# Patient Record
Sex: Female | Born: 1990
Health system: Southern US, Community
[De-identification: ages and names within clinical notes are randomized; demographics above are authoritative.]

## PROBLEM LIST (undated history)

## (undated) ENCOUNTER — Emergency Department: Discharge status: Absent without leave | Payer: Self-pay

## (undated) DIAGNOSIS — J45909 Unspecified asthma, uncomplicated: Secondary | ICD-10-CM

## (undated) DIAGNOSIS — L309 Dermatitis, unspecified: Secondary | ICD-10-CM

## (undated) DIAGNOSIS — F419 Anxiety disorder, unspecified: Secondary | ICD-10-CM

## (undated) DIAGNOSIS — Z9289 Personal history of other medical treatment: Secondary | ICD-10-CM

## (undated) HISTORY — DX: Dermatitis, unspecified: L30.9

## (undated) HISTORY — DX: Personal history of other medical treatment: Z92.89

## (undated) HISTORY — PX: OTHER SURGICAL HISTORY: SHX169

## (undated) HISTORY — PX: NO PAST SURGERIES: SHX2092

---

## 2012-08-19 ENCOUNTER — Ambulatory Visit
Admission: RE | Admit: 2012-08-19 | Discharge: 2012-08-19 | Disposition: A | Discharge status: Home / Self Care | Payer: PRIVATE HEALTH INSURANCE | Source: Ambulatory Visit | Attending: Family Medicine | Admitting: Family Medicine

## 2012-08-19 ENCOUNTER — Other Ambulatory Visit: Payer: Self-pay | Admitting: Family Medicine

## 2012-08-19 DIAGNOSIS — R52 Pain, unspecified: Secondary | ICD-10-CM

## 2013-08-10 ENCOUNTER — Emergency Department (HOSPITAL_COMMUNITY)
Admission: EM | Admit: 2013-08-10 | Discharge: 2013-08-10 | Disposition: A | Discharge status: Home / Self Care | Payer: PRIVATE HEALTH INSURANCE | Source: Home / Self Care

## 2015-10-16 ENCOUNTER — Emergency Department (HOSPITAL_COMMUNITY)
Admission: EM | Admit: 2015-10-16 | Discharge: 2015-10-17 | Disposition: A | Discharge status: Home / Self Care | Payer: Self-pay | Attending: Emergency Medicine | Admitting: Emergency Medicine

## 2015-10-16 DIAGNOSIS — F419 Anxiety disorder, unspecified: Secondary | ICD-10-CM | POA: Insufficient documentation

## 2015-10-16 DIAGNOSIS — J45901 Unspecified asthma with (acute) exacerbation: Secondary | ICD-10-CM | POA: Insufficient documentation

## 2015-10-16 DIAGNOSIS — Z79899 Other long term (current) drug therapy: Secondary | ICD-10-CM | POA: Insufficient documentation

## 2015-10-16 HISTORY — DX: Unspecified asthma, uncomplicated: J45.909

## 2015-10-17 ENCOUNTER — Encounter (HOSPITAL_COMMUNITY): Payer: Self-pay | Admitting: Family Medicine

## 2015-10-17 LAB — I-STAT CHEM 8, ED
BUN: 9 mg/dL (ref 6–20)
CALCIUM ION: 1.18 mmol/L (ref 1.12–1.23)
CHLORIDE: 104 mmol/L (ref 101–111)
CREATININE: 0.8 mg/dL (ref 0.44–1.00)
GLUCOSE: 82 mg/dL (ref 65–99)
HCT: 41 % (ref 36.0–46.0)
Hemoglobin: 13.9 g/dL (ref 12.0–15.0)
POTASSIUM: 3.9 mmol/L (ref 3.5–5.1)
Sodium: 140 mmol/L (ref 135–145)
TCO2: 24 mmol/L (ref 0–100)

## 2015-10-17 LAB — CBC WITH DIFFERENTIAL/PLATELET
BASOS ABS: 0 10*3/uL (ref 0.0–0.1)
Basophils Relative: 0 %
Eosinophils Absolute: 0.3 10*3/uL (ref 0.0–0.7)
Eosinophils Relative: 4 %
HEMATOCRIT: 36.9 % (ref 36.0–46.0)
HEMOGLOBIN: 12.6 g/dL (ref 12.0–15.0)
LYMPHS ABS: 2.9 10*3/uL (ref 0.7–4.0)
LYMPHS PCT: 39 %
MCH: 32.4 pg (ref 26.0–34.0)
MCHC: 34.1 g/dL (ref 30.0–36.0)
MCV: 94.9 fL (ref 78.0–100.0)
Monocytes Absolute: 0.5 10*3/uL (ref 0.1–1.0)
Monocytes Relative: 7 %
NEUTROS ABS: 3.7 10*3/uL (ref 1.7–7.7)
NEUTROS PCT: 50 %
PLATELETS: 303 10*3/uL (ref 150–400)
RBC: 3.89 MIL/uL (ref 3.87–5.11)
RDW: 12.4 % (ref 11.5–15.5)
WBC: 7.4 10*3/uL (ref 4.0–10.5)

## 2015-10-17 NOTE — ED Notes (Signed)
Pt reports she got woke up with shortness of breath,  Stating "I could not control my movements".  She reports this happened 3 times on 12/21. Pt appears in no acute distress and can use move all extremities.

## 2015-10-17 NOTE — Discharge Instructions (Signed)
Panic Attacks Panic attacks are sudden, short feelings of great fear or discomfort. You may have them for no reason when you are relaxed, when you are uneasy (anxious), or when you are sleeping.  HOME CARE  Take all your medicines as told.  Check with your doctor before starting new medicines.  Keep all doctor visits. GET HELP IF:  You are not able to take your medicines as told.  Your symptoms do not get better.  Your symptoms get worse. GET HELP RIGHT AWAY IF:  Your attacks seem different than your normal attacks.  You have thoughts about hurting yourself or others.  You take panic attack medicine and you have a side effect. MAKE SURE YOU:  Understand these instructions.  Will watch your condition.  Will get help right away if you are not doing well or get worse.   This information is not intended to replace advice given to you by your health care provider. Make sure you discuss any questions you have with your health care provider.   Document Released: 11/02/2010 Document Revised: 07/21/2013 Document Reviewed: 05/14/2013 Elsevier Interactive Patient Education [employer] Please try to avoid caffeine.  Please monitor any further episodes similar to tonight's keep this in a diary for your primary care physician.  Return anytime you concerned about her overall health  Emergency Department Resource Guide 1) Find a Doctor and Pay Out of Pocket Although you won't have to find out who is covered by your insurance plan, it is a good idea to ask around and get recommendations. You will then need to call the office and see if the doctor you have chosen will accept you as a new patient and what types of options they offer for patients who are self-pay. Some doctors offer discounts or will set up payment plans for their patients who do not have insurance, but you will need to ask so you aren't surprised when you get to your appointment.  2) Contact Your Local Health  Department Not all health departments have doctors that can see patients for sick visits, but many do, so it is worth a call to see if yours does. If you don't know where your local health department is, you can check in your phone book. The CDC also has a tool to help you locate your state's health department, and many state websites also have listings of all of their local health departments.  3) Find a Walk-in Clinic If your illness is not likely to be very severe or complicated, you may want to try a walk in clinic. These are popping up all over the country in pharmacies, drugstores, and shopping centers. They're usually staffed by nurse practitioners or physician assistants that have been trained to treat common illnesses and complaints. They're usually fairly quick and inexpensive. However, if you have serious medical issues or chronic medical problems, these are probably not your best option.  No Primary Care Doctor: - Call Health Connect at  [phone] - they can help you locate a primary care doctor that  accepts your insurance, provides certain services, etc. - Physician Referral Service- [phone]  Chronic Pain Problems: Organization         Address  Phone   Notes  [facility]  [phone] Patients need to be referred by their primary care doctor.   Medication Assistance: Organization         Address  Phone   Notes  [facility] Medication Assistance Program [phone]  [address] [place], [place] [address] [phone]) [phone] --Must be a resident of [place] [place] -- Must have NO insurance coverage whatsoever (no Medicaid/ Medicare, etc.) -- The pt. MUST have a primary care doctor that directs their care regularly and follows them in the community   MedAssist  [phone]) [phone]   [employer]  [phone]) [phone]    Agencies that provide inexpensive medical care: Organization         Address  Phone   Notes  [facility]  [phone]   [facility]    [phone](336) [phone]   [facility] [address] [place], [place] [address] [phone]) [phone]   [facility] [address], [place] [phone]) [phone]   [facility]anned Parenthood    [phone]) [phone]   [facility]    [phone]) [phone]   [facility]  [address], [place] Phone:  [phone]) [phone], Fax:  [phone] Hours of Operation:  9 am - 6 pm, M-F.  Also accepts Medicaid/Medicare and self-pay.  [facility]  [address], [place] Phone: [phone](336) [phone], Fax: [phone]. Hours of Operation:  8:30 am - 5:30 pm, M-F.  Also accepts Medicaid and self-pay.  [facility] [place] [address], [place] Phone: [phone](336) [phone]   [facility] [address], [place], [place] [phone], Ext. [phone] Mondays & Thursdays: 7-9 AM.  First 15 patients are seen on a first come, first serve basis.    Medicaid-accepting [facility] Providers:  Organization         Address  Phone   Notes  [facility] [address], [place] [phone]) [phone] Also accepts self-pay patients.  [facility] [address], [place]  [phone]) [phone]   [facility] [address], [place] [phone]) [phone]   [facility] [address] [place] [address], [place] [phone]) [phone]   Renaye RakersVeita Bland [address], [place]   [phone](336) [phone] Only accepts [place] Access [place] patients after they have their name applied to their card.   Self-Pay (no insurance) in [facility]:  Organization         Address  Phone   Notes  Sickle Cell Patients, [facility] [address], [place] [phone]) [phone]   [facility] [address], [place] [phone]) [phone]   [facility] [place]  [address],  [place] [phone]) [phone]   [facility]/Dr. Osei-Bonsu  [address] [place] [address], [place] or [phone], [place] [phone]) [phone] Phone number for both [place] and [place] locations is the same.  [facility] [address], [place] [phone]) [phone]   [facility] [place] [address] [place] [address], [place] or [address] [phone](336) [phone]   [facility] [address], [place] [phone](336) [phone], phone; [phone]) [phone], fax Sees patients 1st and 3rd Saturday of every month.  Must not qualify for public or private insurance (i.e. Medicaid, Medicare, [place] Health Choice, Veterans' Benefits)  Household income should be no more than 200% of the poverty level The clinic cannot treat you if you are pregnant or think you are pregnant  Sexually transmitted diseases are not treated at the clinic.  Dental Care: Organization         Address  Phone  Notes  [facility] Department of [facility] [address] [place], [place] [phone] Accepts children up to age 34 who are enrolled in [place] or [place] Health Choice; pregnant women with a Medicaid card; and children who have applied for Medicaid or [place] Health Choice, but were declined, whose parents can pay a reduced fee at time of service.  [facility] Department of [facility]  [address], [place] [phone] Accepts children up to age 64 who are enrolled in [place] or [place] Health Choice; pregnant women with a Medicaid card; and children who have applied for Medicaid or [place] Health Choice, but were declined, whose parents can pay a reduced fee at time of service.  [facility]  [address] [place], [place] [phone] Patients are seen by appointment only. Walk-ins are not accepted. [facility] will see patients 8 years of age and older. Monday - Tuesday (8am-5pm) Most Wednesdays  (8:30-5pm) $30 per visit, cash only  [facility]  [address], [facility] [phone] Patients are seen by appointment only. Walk-ins are not accepted. [facility] will see patients 59 years of age and older. One Wednesday Evening (Monthly: Volunteer Based).  $30 per visit, cash only  [employer]  [phone] for adults; Children under age 36, call Graduate Pediatric Dentistry at [phone]. Children aged 72-14, please call [phone] to request a pediatric application.  Dental services are provided in all areas of dental care including fillings, crowns and bridges, complete and partial dentures, implants, gum treatment, root canals, and extractions. Preventive care is also provided. Treatment is provided to both adults and children. Patients are selected via a lottery and there is often a waiting list.   [facility] [address] [place]  [phone] [email or web address]   [facility] [address] [place], [place] [phone], Ext. [phone] Second and Fourth Thursday of each month, opens at 6:30 AM; Clinic ends at 9 AM.  Patients are seen on a first-come first-served basis, and a limited number are seen during each clinic.   [facility]  [address] [place], [place] [phone]   Eligibility Requirements You must have lived in [place], [place], or [place] counties for at least the last three months.   You cannot be eligible for state or federal sponsored [place], including [employer], [place], or [employer].   You generally cannot be eligible for healthcare insurance through your employer.    How to apply: Eligibility screenings are held every Tuesday and Wednesday afternoon from 1:00 pm until 4:00 pm. You do not need an appointment for the interview!  [facility] [address], [place], [place] [phone]   [facility]  Health Department  [phone]   [facility] Health Department  [phone]   [facility] Health Department  [phone]    Behavioral Health Resources in the Community: Intensive Outpatient Programs Organization         Address  Phone  Notes  [facility] [address], [place], [place] [phone]   [facility] [address] [place], [place] [phone]   [facility] [address] [place], [place]  [phone]   [facility] Mental Health [address],  [place], [place] [phone] or [phone]   Substance Abuse Resources Organization         Address  Phone  Notes  Alcohol [facility] Drug Services  [phone]   [facility]  [phone]   [facility] [place]  [phone]   Floydene Flock  [phone]   Residential & Outpatient Substance Abuse Program  [phone]   Psychological Services Organization         Address  Phone  Notes  [facility]  [phone]   [facility]  [phone]   [facility] [address], [place] [phone] or [phone]    Mobile Crisis Teams Organization         Address  Phone  Notes  [facility], Mobile Crisis Care Unit  [phone]   [facility]  [address] [place], [place] [phone]   Doristine Locks [address] [place] [phone]    Self-Help/Support Groups Organization         Address  Phone             Notes  [facility] [place] - variety of support groups  [phone]- [phone] Call for more information  Narcotics Anonymous (NA), [facility] [address], [employer] [place]  2 meetings at this location   Statistician         Address  Phone  Notes  [facility] [address],    [place]  [phone]   [facility]  [address], [place] [address], [place], [place]  [phone]   [facility] [address] [place], [place] [phone] Admissions: 8am-3pm M-F  [facility] [address]    [place], [place] [phone]   [facility] [address] [place], [place], [place] [phone]   [facility] [address],  [place], [place] [phone]   [facility] - Intensive Outpatient [address], [place], [place] [phone]   [facility] (Addiction Recovery [facility] [address]  [place], [place] [phone] or [phone]   [facility] ([facility]) [address] [place], [place] [phone] Accepts Medicaid  [facility] [address],  [place] [phone] Substance Abuse/Addiction Treatment   [facility] Organization         Address  Phone  Notes  [facility]  [phone]   Angie Fava, PhD [address] [place], [place]   [phone] or [phone]   [facility]al   [address] [place], [place] [phone]   [facility] [address] [place], [place] [phone] Insurance/Medicaid/sponsorship through [facility] and Families [address]                                    [place], [place] [phone] Therapy/tele-psych/case  [facility] [address], [place] [phone]    Dr. Lolly Mustache  [phone]   [facility]  [employer] [facility] Dept. 1) [address] [place] 2) [address] [place] 3)  [address], [place] [phone]  [phone]   [facility] Child Abuse Hotline (878)706-8870 or [phone] (After Hours)

## 2015-10-17 NOTE — ED Provider Notes (Signed)
CSN: [id number]     Arrival date & time 10/16/15  2320 History   First MD Initiated Contact with Patient 10/17/15 [phone]     Chief Complaint  Patient presents with  . Panic Attack     (Consider location/radiation/quality/duration/timing/severity/associated sxs/prior Treatment) HPI Comments: She states that she woke up suddenly feeling short of breath.  Her heart was pounding.  Her hands were crampy.  She feels better, since she's arrived in the emergency department.  She states she had 3 episodes like this on December 21.  After flying to [place].  She is a Chartered loss adjuster.  She was on vacation at the time of her first episodes.  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  She states that she is not particularly stressed by anything.  She has no history of panic attacks.  She is very sensitive to caffeine.  She has been avoiding coffee, but did not realize that T and chocolate are also loaded with caffeine and she does drink lots of tea.  The history is provided by the patient.    Past Medical History  Diagnosis Date  . Asthma    History reviewed. No pertinent past surgical history. History reviewed. No pertinent family history. Social History  Substance Use Topics  . Smoking status: Never Smoker   . Smokeless tobacco: None  . Alcohol Use: No   OB History    No data available     Review of Systems  Constitutional: Negative for fever and chills.  Respiratory: Positive for shortness of breath. Negative for cough, wheezing and stridor.   Cardiovascular: Negative for chest pain.  Gastrointestinal: Negative for nausea.  Skin: Negative for rash and wound.  Neurological: Positive for numbness. Negative for dizziness.  Psychiatric/Behavioral: Negative for suicidal ideas. The patient is nervous/anxious.   All other systems reviewed and are negative.     Allergies  Food  Home Medications   Prior to Admission medications   Medication Sig Start Date End Date Taking? Authorizing  Provider  albuterol (PROVENTIL HFA;VENTOLIN HFA) 108 (90 Base) MCG/ACT inhaler Inhale 1-2 puffs into the lungs every 6 (six) hours as needed for wheezing or shortness of breath.   Yes Historical Provider, MD  ibuprofen (ADVIL,MOTRIN) 200 MG tablet Take 400 mg by mouth every 6 (six) hours as needed (for pain.).   Yes Historical Provider, MD  Multiple Vitamin (MULTIVITAMIN WITH MINERALS) TABS tablet Take 1 tablet by mouth daily. VEGETARIAN MULTIVITAMIN   Yes Historical Provider, MD   BP 96/65 mmHg  Pulse 76  Temp(Src) 98.5 F (36.9 C) (Oral)  Resp 20  Ht 5\' 6"  (1.676 m)  Wt 51.71 kg  BMI 18.41 kg/m2  SpO2 100%  LMP 10/04/2015 Physical Exam  Constitutional: She is oriented to person, place, and time. She appears well-developed and well-nourished.  HENT:  Head: Normocephalic.  Eyes: Pupils are equal, round, and reactive to light.  Neck: Normal range of motion.  Cardiovascular: Normal rate and regular rhythm.   Pulmonary/Chest: Effort normal and breath sounds normal. No respiratory distress.  Neurological: She is alert and oriented to person, place, and time.  Skin: Skin is warm.  Psychiatric: She has a normal mood and affect.  Nursing note and vitals reviewed.   ED Course  Procedures (including critical care time) Labs Review Labs Reviewed  CBC WITH DIFFERENTIAL/PLATELET  I-STAT CHEM 8, ED    Imaging Review No results found. I have personally reviewed and evaluated these images and lab results as part of my medical decision-making.  EKG Interpretation None      MDM   Final diagnoses:  Anxiety         Earley FavorGail Taylah Dubiel, NP 10/17/15 0327  Melene Planan Floyd, DO 10/17/15 0422

## 2015-11-07 ENCOUNTER — Emergency Department (HOSPITAL_COMMUNITY): Payer: Self-pay

## 2015-11-07 ENCOUNTER — Emergency Department (HOSPITAL_COMMUNITY)
Admission: EM | Admit: 2015-11-07 | Discharge: 2015-11-07 | Disposition: A | Discharge status: Home / Self Care | Payer: Self-pay | Attending: Emergency Medicine | Admitting: Emergency Medicine

## 2015-11-07 ENCOUNTER — Encounter (HOSPITAL_COMMUNITY): Payer: Self-pay

## 2015-11-07 DIAGNOSIS — R42 Dizziness and giddiness: Secondary | ICD-10-CM | POA: Insufficient documentation

## 2015-11-07 DIAGNOSIS — Z79899 Other long term (current) drug therapy: Secondary | ICD-10-CM | POA: Insufficient documentation

## 2015-11-07 DIAGNOSIS — R002 Palpitations: Secondary | ICD-10-CM | POA: Insufficient documentation

## 2015-11-07 DIAGNOSIS — R072 Precordial pain: Secondary | ICD-10-CM | POA: Insufficient documentation

## 2015-11-07 DIAGNOSIS — J45901 Unspecified asthma with (acute) exacerbation: Secondary | ICD-10-CM | POA: Insufficient documentation

## 2015-11-07 DIAGNOSIS — R202 Paresthesia of skin: Secondary | ICD-10-CM | POA: Insufficient documentation

## 2015-11-07 LAB — I-STAT TROPONIN, ED
TROPONIN I, POC: 0 ng/mL (ref 0.00–0.08)
TROPONIN I, POC: 0 ng/mL (ref 0.00–0.08)

## 2015-11-07 LAB — BASIC METABOLIC PANEL
ANION GAP: 12 (ref 5–15)
BUN: 6 mg/dL (ref 6–20)
CALCIUM: 10.6 mg/dL — AB (ref 8.9–10.3)
CO2: 26 mmol/L (ref 22–32)
Chloride: 103 mmol/L (ref 101–111)
Creatinine, Ser: 0.87 mg/dL (ref 0.44–1.00)
GLUCOSE: 126 mg/dL — AB (ref 65–99)
POTASSIUM: 4.3 mmol/L (ref 3.5–5.1)
SODIUM: 141 mmol/L (ref 135–145)

## 2015-11-07 LAB — CBC
HCT: 39.8 % (ref 36.0–46.0)
Hemoglobin: 13.7 g/dL (ref 12.0–15.0)
MCH: 32.2 pg (ref 26.0–34.0)
MCHC: 34.4 g/dL (ref 30.0–36.0)
MCV: 93.4 fL (ref 78.0–100.0)
PLATELETS: 311 10*3/uL (ref 150–400)
RBC: 4.26 MIL/uL (ref 3.87–5.11)
RDW: 12.5 % (ref 11.5–15.5)
WBC: 8.1 10*3/uL (ref 4.0–10.5)

## 2015-11-07 LAB — URINALYSIS, ROUTINE W REFLEX MICROSCOPIC
BILIRUBIN URINE: NEGATIVE
Glucose, UA: NEGATIVE mg/dL
KETONES UR: NEGATIVE mg/dL
Leukocytes, UA: NEGATIVE
Nitrite: NEGATIVE
Protein, ur: NEGATIVE mg/dL
SPECIFIC GRAVITY, URINE: 1.003 — AB (ref 1.005–1.030)
pH: 6.5 (ref 5.0–8.0)

## 2015-11-07 LAB — URINE MICROSCOPIC-ADD ON

## 2015-11-07 NOTE — Discharge Instructions (Signed)

## 2015-11-07 NOTE — ED Notes (Signed)
Pt complains a pressure like chest pain, shortness of breath and back pain since midnight tonight

## 2015-11-07 NOTE — ED Provider Notes (Signed)
CSN: [id number]     Arrival date & time 11/07/15  0016 History  By signing my name below, I, Budd Palmer, attest that this documentation has been prepared under the direction and in the presence of Paula Libra, MD. Electronically Signed: Budd Palmer, ED Scribe. 11/07/2015. 3:51 AM.     Chief Complaint  Patient presents with  . Palpitations   The history is provided by the patient. No language interpreter was used.   HPI Comments: Brandy Pena is a 25 y.o. female who presents to the Emergency Department complaining of intermittent palpitations onset about 4 hours ago. Pt states she was in bed reading when the palpitations began, with intermittent episodes that lasted several minutes each and resolved after 30 minutes. She reports associated mild, sub-sternal chest pressure, SOB, lightheadedness and tingling in her feet with each episode. She notes all symptoms have since resolved. She has been seen for the same once before when she was told her symptoms were most likely due to anxiety. She denies recent travel and is not on birth control. Pt denies leg swelling or pain.   Past Medical History  Diagnosis Date  . Asthma    History reviewed. No pertinent past surgical history. History reviewed. No pertinent family history. Social History  Substance Use Topics  . Smoking status: Never Smoker   . Smokeless tobacco: None  . Alcohol Use: No   OB History    No data available     Review of Systems  All other systems reviewed and are negative.   Allergies  Food  Home Medications   Prior to Admission medications   Medication Sig Start Date End Date Taking? Authorizing Provider  albuterol (PROVENTIL HFA;VENTOLIN HFA) 108 (90 Base) MCG/ACT inhaler Inhale 1-2 puffs into the lungs every 6 (six) hours as needed for wheezing or shortness of breath.   Yes Historical Provider, MD  ibuprofen (ADVIL,MOTRIN) 200 MG tablet Take 400 mg by mouth every 6 (six) hours as needed (for pain.).   Yes  Historical Provider, MD  Multiple Vitamin (MULTIVITAMIN WITH MINERALS) TABS tablet Take 1 tablet by mouth daily. VEGETARIAN MULTIVITAMIN   Yes Historical Provider, MD   BP 102/74 mmHg  Pulse 83  Temp(Src) 97.8 F (36.6 C) (Oral)  Resp 14  Ht  (1.549 m)  Wt 114 lb (51.71 kg)  BMI 21.55 kg/m2  SpO2 100%  LMP 11/02/2015 Physical Exam General: Well-developed, well-nourished female in no acute distress; appearance consistent with age of record HENT: normocephalic; atraumatic Eyes: pupils equal, round and reactive to light; extraocular muscles intact Neck: supple; no thryomegaly  Heart: regular rate and rhythm; no murmurs, rubs or gallops Lungs: clear to auscultation bilaterally Abdomen: soft; nondistended; nontender; no masses or hepatosplenomegaly; bowel sounds present Extremities: No deformity; full range of motion; pulses normal; no edema Neurologic: Awake, alert and oriented; motor function intact in all extremities and symmetric; no facial droop Skin: Warm and dry Psychiatric: Normal mood and affect   ED Course  Procedures   MDM   Nursing notes and vitals signs, including pulse oximetry, reviewed.  Summary of this visit's results, reviewed by myself:  Labs:  Results for orders placed or performed during the hospital encounter of 11/07/15 (from the past 24 hour(s))  CBC     Status: None   Collection Time: 11/07/15 12:56 AM  Result Value Ref Range   WBC 8.1 4.0 - 10.5 K/uL   RBC 4.26 3.87 - 5.11 MIL/uL   Hemoglobin 13.7 12.0 - 15.0 g/dL  HCT 39.8 36.0 - 46.0 %   MCV 93.4 78.0 - 100.0 fL   MCH 32.2 26.0 - 34.0 pg   MCHC 34.4 30.0 - 36.0 g/dL   RDW 16.1 09.6 - 04.5 %   Platelets 311 150 - 400 K/uL  Basic metabolic panel     Status: Abnormal   Collection Time: 11/07/15 12:56 AM  Result Value Ref Range   Sodium 141 135 - 145 mmol/L   Potassium 4.3 3.5 - 5.1 mmol/L   Chloride 103 101 - 111 mmol/L   CO2 26 22 - 32 mmol/L   Glucose, Bld 126 (H) 65 - 99 mg/dL    BUN 6 6 - 20 mg/dL   Creatinine, Ser 4.09 0.44 - 1.00 mg/dL   Calcium 81.1 (H) 8.9 - 10.3 mg/dL   GFR calc non Af Amer >60 >60 mL/min   GFR calc Af Amer >60 >60 mL/min   Anion gap 12 5 - 15  I-stat troponin, ED (not at [facility], [facility])     Status: None   Collection Time: 11/07/15  1:03 AM  Result Value Ref Range   Troponin i, poc 0.00 0.00 - 0.08 ng/mL   Comment 3          Urinalysis, Routine w reflex microscopic (not at [facility])     Status: Abnormal   Collection Time: 11/07/15  1:38 AM  Result Value Ref Range   Color, Urine YELLOW YELLOW   APPearance CLEAR CLEAR   Specific Gravity, Urine 1.003 (L) 1.005 - 1.030   pH 6.5 5.0 - 8.0   Glucose, UA NEGATIVE NEGATIVE mg/dL   Hgb urine dipstick MODERATE (A) NEGATIVE   Bilirubin Urine NEGATIVE NEGATIVE   Ketones, ur NEGATIVE NEGATIVE mg/dL   Protein, ur NEGATIVE NEGATIVE mg/dL   Nitrite NEGATIVE NEGATIVE   Leukocytes, UA NEGATIVE NEGATIVE  Urine microscopic-add on     Status: Abnormal   Collection Time: 11/07/15  1:38 AM  Result Value Ref Range   Squamous Epithelial / LPF 0-5 (A) NONE SEEN   WBC, UA 0-5 0 - 5 WBC/hpf   RBC / HPF 0-5 0 - 5 RBC/hpf   Bacteria, UA RARE (A) NONE SEEN  I-stat troponin, ED     Status: None   Collection Time: 11/07/15  4:02 AM  Result Value Ref Range   Troponin i, poc 0.00 0.00 - 0.08 ng/mL   Comment 3            Imaging Studies: Dg Chest 2 View  11/07/2015  CLINICAL DATA:  Chest pain. Heart racing for several hours. Similar symptoms a couple of weeks ago. Pressure on the chest. EXAM: CHEST  2 VIEW COMPARISON:  None. FINDINGS: The heart size and mediastinal contours are within normal limits. Both lungs are clear. The visualized skeletal structures are unremarkable. IMPRESSION: No active cardiopulmonary disease. Electronically Signed   By: Burman Nieves M.D.   On: 11/07/2015 01:29   4:22 AM Patient was previously diagnosed with anxiety attack as the cause of her symptoms. It is possible that she has been  having this paroxysmal supraventricular tachycardia. We will refer her to cardiology for long-term monitoring.  Final diagnoses:  Palpitations   I personally performed the services described in this documentation, which was scribed in my presence. The recorded information has been reviewed and is accurate.   Paula Libra, MD 11/07/15 [phone]

## 2015-11-10 ENCOUNTER — Encounter: Payer: Self-pay | Admitting: Physician Assistant

## 2015-11-11 ENCOUNTER — Emergency Department (HOSPITAL_COMMUNITY)
Admission: EM | Admit: 2015-11-11 | Discharge: 2015-11-11 | Disposition: A | Discharge status: Home / Self Care | Payer: PRIVATE HEALTH INSURANCE | Attending: Emergency Medicine | Admitting: Emergency Medicine

## 2015-11-11 ENCOUNTER — Encounter (HOSPITAL_COMMUNITY): Payer: Self-pay | Admitting: *Deleted

## 2015-11-11 DIAGNOSIS — R002 Palpitations: Secondary | ICD-10-CM

## 2015-11-11 DIAGNOSIS — R42 Dizziness and giddiness: Secondary | ICD-10-CM | POA: Diagnosis not present

## 2015-11-11 DIAGNOSIS — F419 Anxiety disorder, unspecified: Secondary | ICD-10-CM | POA: Insufficient documentation

## 2015-11-11 DIAGNOSIS — Z79899 Other long term (current) drug therapy: Secondary | ICD-10-CM | POA: Diagnosis not present

## 2015-11-11 DIAGNOSIS — R202 Paresthesia of skin: Secondary | ICD-10-CM | POA: Diagnosis not present

## 2015-11-11 DIAGNOSIS — Z3202 Encounter for pregnancy test, result negative: Secondary | ICD-10-CM | POA: Diagnosis not present

## 2015-11-11 DIAGNOSIS — R000 Tachycardia, unspecified: Secondary | ICD-10-CM | POA: Diagnosis present

## 2015-11-11 DIAGNOSIS — J45901 Unspecified asthma with (acute) exacerbation: Secondary | ICD-10-CM | POA: Insufficient documentation

## 2015-11-11 DIAGNOSIS — R0789 Other chest pain: Secondary | ICD-10-CM | POA: Diagnosis not present

## 2015-11-11 HISTORY — DX: Anxiety disorder, unspecified: F41.9

## 2015-11-11 LAB — CBC WITH DIFFERENTIAL/PLATELET
Basophils Absolute: 0 10*3/uL (ref 0.0–0.1)
Basophils Relative: 0 %
EOS ABS: 0.2 10*3/uL (ref 0.0–0.7)
EOS PCT: 3 %
HCT: 36.9 % (ref 36.0–46.0)
Hemoglobin: 12.7 g/dL (ref 12.0–15.0)
LYMPHS ABS: 2.8 10*3/uL (ref 0.7–4.0)
Lymphocytes Relative: 39 %
MCH: 31.8 pg (ref 26.0–34.0)
MCHC: 34.4 g/dL (ref 30.0–36.0)
MCV: 92.5 fL (ref 78.0–100.0)
MONO ABS: 0.4 10*3/uL (ref 0.1–1.0)
MONOS PCT: 6 %
Neutro Abs: 3.7 10*3/uL (ref 1.7–7.7)
Neutrophils Relative %: 52 %
PLATELETS: 307 10*3/uL (ref 150–400)
RBC: 3.99 MIL/uL (ref 3.87–5.11)
RDW: 12.2 % (ref 11.5–15.5)
WBC: 7.1 10*3/uL (ref 4.0–10.5)

## 2015-11-11 LAB — BASIC METABOLIC PANEL
Anion gap: 9 (ref 5–15)
BUN: 8 mg/dL (ref 6–20)
CHLORIDE: 105 mmol/L (ref 101–111)
CO2: 26 mmol/L (ref 22–32)
CREATININE: 0.72 mg/dL (ref 0.44–1.00)
Calcium: 9.6 mg/dL (ref 8.9–10.3)
GFR calc Af Amer: >60 mL/min (ref >60)
Glucose, Bld: 105 mg/dL — ABNORMAL HIGH (ref 65–99)
Potassium: 4.5 mmol/L (ref 3.5–5.1)
SODIUM: 140 mmol/L (ref 135–145)

## 2015-11-11 LAB — POC URINE PREG, ED: Preg Test, Ur: NEGATIVE

## 2015-11-11 LAB — TROPONIN I

## 2015-11-11 NOTE — ED Notes (Signed)
The pt has had a rapoid nheartv rate since Monday intermittently  She has had the same in the past.  Today the rapid heart rate  Was also accompanied by a pain in her rt toes up to her knees.  She has ben diagnosed with panic attacks in the past.  She has a cards appointment next week  lmp jan 19th

## 2015-11-11 NOTE — ED Provider Notes (Signed)
CSN: [id number]     Arrival date & time 11/11/15  2011 History   First MD Initiated Contact with Patient 11/11/15 2050     Chief Complaint  Patient presents with  . Tachycardia     (Consider location/radiation/quality/duration/timing/severity/associated sxs/prior Treatment) HPI Brandy Pena is a 25 y.o. female with hx of asthma and anxiety, presents to ED with complaint of palpitations. Pt states she was cooking in the kitchen, when she suddenly began feeling dizzy and lightheaded. States she then developed tingling in right toes and shortly after developed sensation of heart racing. She states her iwatch said her heart rate was up to 120. She states she tried to "breath through it." reports heart rate came down. States several similar episodes in the past. Was seen twice in ED for the same. Stopped all caffeine and supplements. States always preceded with dizziness and tingling in feet. States one time it happened after walking into a smokey house. Pt states "i dont like smoke, i have asthma." states one time it started after someone told her that they knew someone who had tingling in the feet and later died from a blood clot. Vision does report history of anxiety and panic attacks, however she is not sure if this is what these episodes are. Patient states "I just don't want to drop dead one day and during his episode i feel like I am about to die." Patient is currently asymptomatic. She denies history of blood clots. She denies any swelling in her extremities. She denies any chest pain. No pleuritic pain in her chest. She denies taking birth control. She is a nonsmoker.  Past Medical History  Diagnosis Date  . Asthma   . Anxiety    Past Surgical History  Procedure Laterality Date  . None     No family history on file. Social History  Substance Use Topics  . Smoking status: Never Smoker   . Smokeless tobacco: None  . Alcohol Use: No   OB History    No data available     Review of  Systems  Constitutional: Negative for fever and chills.  Respiratory: Positive for chest tightness and shortness of breath. Negative for cough.   Cardiovascular: Positive for palpitations. Negative for chest pain and leg swelling.  Gastrointestinal: Negative for nausea, vomiting, abdominal pain and diarrhea.  Genitourinary: Negative for dysuria, flank pain and pelvic pain.  Musculoskeletal: Negative for myalgias, arthralgias, neck pain and neck stiffness.  Skin: Negative for rash.  Neurological: Negative for dizziness, weakness and headaches.  Psychiatric/Behavioral: The patient is nervous/anxious.   All other systems reviewed and are negative.     Allergies  Food  Home Medications   Prior to Admission medications   Medication Sig Start Date End Date Taking? Authorizing Provider  albuterol (PROVENTIL HFA;VENTOLIN HFA) 108 (90 Base) MCG/ACT inhaler Inhale 1-2 puffs into the lungs every 6 (six) hours as needed for wheezing or shortness of breath.    Historical Provider, MD  ibuprofen (ADVIL,MOTRIN) 200 MG tablet Take 400 mg by mouth every 6 (six) hours as needed (for pain.).    Historical Provider, MD  Multiple Vitamin (MULTIVITAMIN WITH MINERALS) TABS tablet Take 1 tablet by mouth daily. VEGETARIAN MULTIVITAMIN    Historical Provider, MD   BP 115/80 mmHg  Pulse 90  Temp(Src) 97.4 F (36.3 C) (Oral)  Resp 18  Wt 50.349 kg  SpO2 100%  LMP 11/02/2015 Physical Exam  Constitutional: She is oriented to person, place, and time. She appears well-developed and  well-nourished. No distress.  HENT:  Head: Normocephalic.  Eyes: Conjunctivae are normal.  Neck: Neck supple.  Cardiovascular: Normal rate, regular rhythm and normal heart sounds.   Pulmonary/Chest: Effort normal and breath sounds normal. No respiratory distress. She has no wheezes. She has no rales. She exhibits no tenderness.  Abdominal: Soft. Bowel sounds are normal. She exhibits no distension. There is no tenderness. There  is no rebound.  Musculoskeletal: She exhibits no edema.  Neurological: She is alert and oriented to person, place, and time.  Skin: Skin is warm and dry.  Psychiatric: She has a normal mood and affect. Her behavior is normal.  Nursing note and vitals reviewed.   ED Course  Procedures (including critical care time) Labs Review Labs Reviewed  BASIC METABOLIC PANEL - Abnormal; Notable for the following:    Glucose, Bld 105 (*)    All other components within normal limits  CBC WITH DIFFERENTIAL/PLATELET  TROPONIN I    Imaging Review No results found. I have personally reviewed and evaluated these images and lab results as part of my medical decision-making.   EKG Interpretation   Date/Time:  Saturday November 11 2015 20:23:28 EST Ventricular Rate:  92 PR Interval:  144 QRS Duration: 82 QT Interval:  360 QTC Calculation: 445 R Axis:   84 Text Interpretation:  Normal sinus rhythm Normal ECG Confirmed by ZAVITZ   MD, JOSHUA ([id number]) on 11/11/2015 9:04:19 PM      MDM   Final diagnoses:  Palpitations   Pt with episodes of tachycardia from home. Pt is symptom free at this time. She reports associated sob and tingling in feet and toes. Two prior evals for the same. Pt in in NAD at this time. NSR on the monitor. ECG with no significant findings. Will check labs and monitor. Pt is wells criteria and PERC negative. She has no risk factors for ACS.    9:55 PM Pt's labs are normal. She was monitored for 1 hr with no events in ED. She has an apt with cardiology in 2 days. At times time stable for dc home. Although considered dysrhythmia, given pt's exam and history, this is more consistent with panic attack or anxiety. Pt instructed deep breathing if happens again and if not improving to return to ER. Pt will most likely benefit from holter monitor once seen by cardiology.   Filed Vitals:   11/11/15 2025 11/11/15 2100  BP: 115/80 110/80  Pulse: 90 77  Temp: 97.4 F (36.3 C)    TempSrc: Oral   Resp: 18 17  Weight: 50.349 kg   SpO2: 100% 100%     Jaynie Crumble, PA-C 11/12/15 1458  Blane Ohara, MD 11/12/15 2059

## 2015-11-11 NOTE — Discharge Instructions (Signed)
Please follow-up with cardiology as scheduled. Please return if worsening symptoms.   Palpitations A palpitation is the feeling that your heartbeat is irregular or is faster than normal. It may feel like your heart is fluttering or skipping a beat. Palpitations are usually not a serious problem. However, in some cases, you may need further medical evaluation. CAUSES  Palpitations can be caused by:  Smoking.  Caffeine or other stimulants, such as diet pills or energy drinks.  Alcohol.  Stress and anxiety.  Strenuous physical activity.  Fatigue.  Certain medicines.  Heart disease, especially if you have a history of irregular heart rhythms (arrhythmias), such as atrial fibrillation, atrial flutter, or supraventricular tachycardia.  An improperly working pacemaker or defibrillator. DIAGNOSIS  To find the cause of your palpitations, your health care provider will take your medical history and perform a physical exam. Your health care provider may also have you take a test called an ambulatory electrocardiogram (ECG). An ECG records your heartbeat patterns over a 24-hour period. You may also have other tests, such as:  Transthoracic echocardiogram (TTE). During echocardiography, sound waves are used to evaluate how blood flows through your heart.  Transesophageal echocardiogram (TEE).  Cardiac monitoring. This allows your health care provider to monitor your heart rate and rhythm in real time.  Holter monitor. This is a portable device that records your heartbeat and can help diagnose heart arrhythmias. It allows your health care provider to track your heart activity for several days, if needed.  Stress tests by exercise or by giving medicine that makes the heart beat faster. TREATMENT  Treatment of palpitations depends on the cause of your symptoms and can vary greatly. Most cases of palpitations do not require any treatment other than time, relaxation, and monitoring your symptoms.  Other causes, such as atrial fibrillation, atrial flutter, or supraventricular tachycardia, usually require further treatment. HOME CARE INSTRUCTIONS   Avoid:  Caffeinated coffee, tea, soft drinks, diet pills, and energy drinks.  Chocolate.  Alcohol.  Stop smoking if you smoke.  Reduce your stress and anxiety. Things that can help you relax include:  A method of controlling things in your body, such as your heartbeats, with your mind (biofeedback).  Yoga.  Meditation.  Physical activity such as swimming, jogging, or walking.  Get plenty of rest and sleep. SEEK MEDICAL CARE IF:   You continue to have a fast or irregular heartbeat beyond 24 hours.  Your palpitations occur more often. SEEK IMMEDIATE MEDICAL CARE IF:  You have chest pain or shortness of breath.  You have a severe headache.  You feel dizzy or you faint. MAKE SURE YOU:  Understand these instructions.  Will watch your condition.  Will get help right away if you are not doing well or get worse.   This information is not intended to replace advice given to you by your health care provider. Make sure you discuss any questions you have with your health care provider.   Document Released: 09/27/2000 Document Revised: 10/05/2013 Document Reviewed: 11/29/2011 Elsevier Interactive Patient Education [employer]

## 2015-11-13 NOTE — Progress Notes (Signed)
Cardiology Office Note:    Date:  11/14/2015   ID:  Lenard Galloway, DOB [birth year], MRN [id number]  PCP:  No PCP Per Patient  Cardiologist:  New - Dr. Lewayne Bunting   Electrophysiologist:  n/a  Chief Complaint  Patient presents with  . Consult for Palpitations    Referred by Dr. Blane Ohara from [facility]    History of Present Illness:    Tobin Cadiente is a 25 y.o. female with no significant PMH aside from mild intermittent asthma.  Since December, she has started to note intermittent palpitations.  There is a sudden onset.  They awaken her from sleep at times.  She feels fatigued and short of breath with the palpitations.  Her HR is rapid.  She now has an Scientist, physiological.  The highest she has seen her HR is 140.  She is usually very active and exercises regularly.  She denies palpitations related to exertion.  She denies chest pain or syncope. She denies orthopnea, PND, edema. She can get lightheaded with her palpitations.  She often has to have a BM after her palpitations subside.  She can tell the palpitations are about to start.  She usually gets hot and notes tingling in her legs before her symptoms start.  She denies heavy caffeine use or OTC medication use.  She denies excessive stress or anxiety or lack of sleep.      Past Medical History  Diagnosis Date  . Asthma     mild intermittent  . Anxiety     Past Surgical History  Procedure Laterality Date  . None      Current Medications: No outpatient prescriptions prior to visit.   No facility-administered medications prior to visit.        Allergies:   Food   Social History   Social History  . Marital Status: Single    Spouse Name: N/A  . Number of Children: N/A  . Years of Education: N/A   Occupational History  . 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 Smoking status: Never Smoker   . Smokeless tobacco: Not on file  . Alcohol Use: No  . Drug Use: No  .  Sexual Activity: Not on file   Other Topics Concern  . Not on file   Social History Narrative   Graduate of [employer] in 2015        Family History:  The patient's family history includes Diabetes in her maternal grandmother; Heart attack (age of onset: 29) in her father.   ROS:   Please see the history of present illness.    Review of Systems  Constitution: Positive for malaise/fatigue.  Cardiovascular: Positive for dyspnea on exertion and irregular heartbeat.  All other systems reviewed and are negative.   Physical Exam:    VS:  BP 110/60 mmHg  Pulse 64  Ht  (1.549 m)  Wt 112 lb (50.803 kg)  BMI 21.17 kg/m2  LMP 11/02/2015    Orthostatic VS for the past 24 hrs:  BP- Lying Pulse- Lying BP- Sitting Pulse- Sitting BP- Standing at 0 minutes Pulse- Standing at 0 minutes  11/14/15 0849 107/69 mmHg 68 108/72 mmHg 71 110/71 mmHg 70   GEN: Well nourished, well developed, in no acute distress HEENT: normal Neck: no JVD, no masses Cardiac: Normal S1/S2, RRR; no murmurs, rubs, or gallops, no edema;  no carotid bruits,   Respiratory:  clear to auscultation bilaterally; no wheezing, rhonchi or rales GI: soft, nontender, nondistended, + BS MS: no deformity or atrophy Skin: warm and dry, no rash Neuro:  Bilateral strength equal, no focal deficits  Psych: Alert and oriented x 3, normal affect  Wt Readings from Last 3 Encounters:  11/14/15 112 lb (50.803 kg)  11/11/15 111 lb (50.349 kg)  11/07/15 114 lb (51.71 kg)      Studies/Labs Reviewed:    EKG:  EKG is  ordered today.  The ekg ordered today demonstrates NSR, HR 64, normal axis, sinus arrhythmia, QTc 389 ms  Recent Labs: 11/11/2015: BUN 8; Creatinine, Ser 0.72; Hemoglobin 12.7; Platelets 307; Potassium 4.5; Sodium 140   Recent Lipid Panel No results found for: CHOL, TRIG, HDL, CHOLHDL, VLDL, LDLCALC, LDLDIRECT  Additional studies/ records that were reviewed today include:   Dg Chest 2 View    11/07/2015     IMPRESSION: No active cardiopulmonary disease. Electronically Signed   By: Burman Nieves M.D.   On: 11/07/2015 01:29      ASSESSMENT:    1. Heart palpitations     PLAN:    In order of problems listed above:  1. Palpitations - Patient presents with intermittent palpitations with sudden onset.  She has never had anything like this before.  She has recorded HR as high as 140. Orthostatic VS are normal.  Her ECG is normal and does have ectopic atrial beats.  She does not drink a lot of caffeine or use any OTC stimulants.  Patient also seen by Dr. Lewayne Bunting (DOD) today.  -  Obtain Echo to rule out structural heart disease  -  Obtain Event monitor x 30 days to r/o significant arrhythmias  -  Check TSH    Medication Adjustments/Labs and Tests Ordered: Current medicines are reviewed at length with the patient today.  Concerns regarding medicines are outlined above.  Medication changes, Labs and Tests ordered today are outlined in the Patient Instructions noted below. Patient Instructions  Medication Instructions:  NONE ORDERED TODAY  If you need a refill on your cardiac medications before your next appointment, please call your pharmacy.  Labwork: TSH   Testing/Procedures: Your physician has requested that you have an echocardiogram. Echocardiography is a painless test that uses sound waves to create images of your heart. It provides your doctor with information about the size and shape of your heart and how well your heart's chambers and valves are working. This procedure takes approximately one hour. There are no restrictions for this procedure.  Your physician has recommended that you wear an event monitor. Event monitors are medical devices that record the heart's electrical activity. Doctors most often [place] these monitors to diagnose arrhythmias. Arrhythmias are problems with the speed or rhythm of the heartbeat. The monitor is a small, portable device. You can wear one while you do  your normal daily activities. This is usually used to diagnose what is causing palpitations/syncope (passing out).  Follow-Up:  6 TO 8 WEEKS WITH DR Ladona Ridgel  Any Other Special Instructions Will Be Listed Below (If Applicable).  Signed, Tereso Newcomer, PA-C  11/14/2015 9:15 AM    [facility] [address] [place], [place], [place]  [address] Phone: [phone]; Fax: [phone]   Cardiology Attending  Patient seen and examined. I have reviewed the findings and seen and examined the patient. Those documented above represent my findings as well. The patient has unexplained tachycardia of uncertain etiology. Will plan a cardiac  monitor. Check TSH. I suspect she has sinus tachycardia.  Leonia Reeves.D.

## 2015-11-14 ENCOUNTER — Encounter: Payer: Self-pay | Admitting: Physician Assistant

## 2015-11-14 ENCOUNTER — Other Ambulatory Visit: Payer: Self-pay | Admitting: *Deleted

## 2015-11-14 ENCOUNTER — Ambulatory Visit (INDEPENDENT_AMBULATORY_CARE_PROVIDER_SITE_OTHER): Payer: PRIVATE HEALTH INSURANCE | Admitting: Physician Assistant

## 2015-11-14 ENCOUNTER — Ambulatory Visit (INDEPENDENT_AMBULATORY_CARE_PROVIDER_SITE_OTHER): Payer: PRIVATE HEALTH INSURANCE

## 2015-11-14 ENCOUNTER — Other Ambulatory Visit (INDEPENDENT_AMBULATORY_CARE_PROVIDER_SITE_OTHER): Payer: PRIVATE HEALTH INSURANCE | Admitting: *Deleted

## 2015-11-14 VITALS — BP 110/60 | HR 64 | Ht 61.0 in | Wt 112.0 lb

## 2015-11-14 DIAGNOSIS — J4520 Mild intermittent asthma, uncomplicated: Secondary | ICD-10-CM | POA: Insufficient documentation

## 2015-11-14 DIAGNOSIS — R002 Palpitations: Secondary | ICD-10-CM | POA: Diagnosis not present

## 2015-11-14 DIAGNOSIS — F419 Anxiety disorder, unspecified: Secondary | ICD-10-CM | POA: Insufficient documentation

## 2015-11-14 LAB — TSH: TSH: 0.295 u[IU]/mL — ABNORMAL LOW (ref 0.350–4.500)

## 2015-11-14 NOTE — Patient Instructions (Addendum)
Medication Instructions:  NONE ORDERED TODAY  If you need a refill on your cardiac medications before your next appointment, please call your pharmacy.  Labwork: TSH   Testing/Procedures: Your physician has requested that you have an echocardiogram. Echocardiography is a painless test that uses sound waves to create images of your heart. It provides your doctor with information about the size and shape of your heart and how well your heart's chambers and valves are working. This procedure takes approximately one hour. There are no restrictions for this procedure.  Your physician has recommended that you wear an event monitor. Event monitors are medical devices that record the heart's electrical activity. Doctors most often [place] these monitors to diagnose arrhythmias. Arrhythmias are problems with the speed or rhythm of the heartbeat. The monitor is a small, portable device. You can wear one while you do your normal daily activities. This is usually used to diagnose what is causing palpitations/syncope (passing out).  Follow-Up:  6 TO 8 WEEKS WITH WEAVER ON A DAY DR Ladona Ridgel IN OFFICE TOO  Any Other Special Instructions Will Be Listed Below (If Applicable).

## 2015-11-15 ENCOUNTER — Telehealth: Payer: Self-pay | Admitting: *Deleted

## 2015-11-15 LAB — T3, FREE: T3, Free: 3 pg/mL (ref 2.3–4.2)

## 2015-11-15 LAB — T4, FREE: FREE T4: 1.32 ng/dL (ref 0.80–1.80)

## 2015-11-15 LAB — TSH: TSH: 0.356 u[IU]/mL (ref 0.350–4.500)

## 2015-11-15 NOTE — Telephone Encounter (Signed)
Pt has been notified of lab results and recommendations to est with a PCP to monitor thyroid function; pt verbal understanding.

## 2015-11-27 ENCOUNTER — Ambulatory Visit (HOSPITAL_COMMUNITY): Payer: PRIVATE HEALTH INSURANCE | Attending: Internal Medicine

## 2015-11-27 ENCOUNTER — Other Ambulatory Visit: Payer: Self-pay

## 2015-11-27 DIAGNOSIS — R002 Palpitations: Secondary | ICD-10-CM | POA: Diagnosis present

## 2015-11-27 DIAGNOSIS — Z8249 Family history of ischemic heart disease and other diseases of the circulatory system: Secondary | ICD-10-CM | POA: Diagnosis not present

## 2015-11-28 ENCOUNTER — Encounter: Payer: Self-pay | Admitting: Physician Assistant

## 2015-11-29 ENCOUNTER — Telehealth: Payer: Self-pay | Admitting: *Deleted

## 2015-11-29 NOTE — Telephone Encounter (Signed)
Pt notified of echo results by phone with verbal understanding 

## 2015-12-13 ENCOUNTER — Telehealth: Payer: Self-pay | Admitting: *Deleted

## 2015-12-13 NOTE — Telephone Encounter (Signed)
Just received [employer] event monitor report, for today at 3:16 am showing ST, rate 166. This was a manual transmission. Left message on patient's voicemail to call office and let us know what was going on at the time.

## 2015-12-14 ENCOUNTER — Encounter (HOSPITAL_COMMUNITY): Payer: Self-pay

## 2015-12-14 ENCOUNTER — Emergency Department (HOSPITAL_COMMUNITY)
Admission: EM | Admit: 2015-12-14 | Discharge: 2015-12-14 | Disposition: A | Discharge status: Home / Self Care | Payer: PRIVATE HEALTH INSURANCE | Attending: Emergency Medicine | Admitting: Emergency Medicine

## 2015-12-14 DIAGNOSIS — Z8659 Personal history of other mental and behavioral disorders: Secondary | ICD-10-CM | POA: Diagnosis not present

## 2015-12-14 DIAGNOSIS — Z79899 Other long term (current) drug therapy: Secondary | ICD-10-CM | POA: Diagnosis not present

## 2015-12-14 DIAGNOSIS — R002 Palpitations: Secondary | ICD-10-CM | POA: Diagnosis present

## 2015-12-14 DIAGNOSIS — I471 Supraventricular tachycardia: Secondary | ICD-10-CM

## 2015-12-14 DIAGNOSIS — J4520 Mild intermittent asthma, uncomplicated: Secondary | ICD-10-CM | POA: Insufficient documentation

## 2015-12-14 NOTE — Telephone Encounter (Signed)
I left a message for the patient to call. 

## 2015-12-14 NOTE — ED Notes (Signed)
Pt stable, ambulatory, states understanding of discharge instructions 

## 2015-12-14 NOTE — Discharge Instructions (Signed)
Paroxysmal Supraventricular Tachycardia Paroxysmal supraventricular tachycardia (PSVT) is a type of abnormal heart rhythm. It causes your heart to beat very quickly and then suddenly stop beating so quickly. A normal heart rate is 60-100 beats per minute. During an episode of PSVT, your heart rate may be 150-250 beats per minute. This can make you feel light-headed and short of breath. An episode of PSVT can be frightening. It is usually not dangerous. The heart has four chambers. All chambers need to work together for the heart to beat effectively. A normal heartbeat usually starts in the right upper chamber of the heart (atrium) when an area (sinoatrial node) puts out an electrical signal that spreads to the other chambers. People with PSVT may have abnormal electrical pathways, or they may have other areas in the upper chambers that send out electrical signals. The result is a very rapid heartbeat. When your heart beats very quickly, it does not have time to fill completely with blood. When PSVT happens often or it lasts for long periods, it can lead to heart weakness and failure. Most people with PSVT do not have any other heart disease. CAUSES Abnormal electrical activity in the heart causes PSVT. It is not known why some people get PSVT and others do not. RISK FACTORS You may be more likely to have PSVT if:  You are 20-30 years old.  You are a woman. Other factors that may increase your chances of an attack include:  Stress.  Being tired.  Smoking.  Stimulant drugs.  Alcoholic drinks.  Caffeine.  Pregnancy. SIGNS AND SYMPTOMS A mild episode of PSVT may cause no symptoms. If you do have signs and symptoms, they may include:  A pounding heart.  Feeling of skipped heartbeats (palpitations).  Weakness.  Shortness of breath.  Tightness or pain in your chest.  Light-headedness.  Anxiety.  Dizziness.  Sweating.  Nausea.  A fainting spell. DIAGNOSIS Your health care  provider may suspect PSVT if you have symptoms that come and go. The health care provider will do a physical exam. If you are having an episode during the exam, the health care provider may be able to diagnose PSVT by listening to your heart and feeling your pulse. Tests may also be done, including:  An electrical study of your heart (electrocardiogram, or ECG).  A test in which you wear a portable ECG monitor all day (Holter monitor) or for several days (event monitor).  A test that involves taking an image of your heart using sound waves (echocardiogram) to rule out other causes of a fast heart rate. TREATMENT You may not need treatment if episodes of PSVT do not happen often or if they do not cause symptoms. If PSVT episodes do cause symptoms, your health care provider may first suggest trying a self-treatment called vagus nerve stimulation. The vagus nerve extends down from the brain. It regulates certain body functions. Stimulating this nerve can slow down the heart. Your health care provider can teach you ways to do this. You may need to try a few ways to find what works best for you. Options include:  Holding your breath and pushing, as though you are having a bowel movement.  Massaging an area on one side of your neck below your jaw.  Bending forward with your head between your legs.  Bending forward with your head between your legs and coughing.  Massaging your eyeballs with your eyes closed. If vagus nerve stimulation does not work, other treatment options include:    Medicines to prevent an attack.  Being treated in the hospital with medicine or electric shock to stop an attack (cardioversion). This treatment can include:  Getting medicine through an IV line.  Having a small electric shock delivered to your heart. You will be given medicine to make you sleep through this procedure.  If you have frequent episodes with symptoms, you may need a procedure to get rid of the faulty  areas of your heart (radiofrequency ablation) and end the episodes of PSVT. In this procedure:  A long, thin tube (catheter) is passed through one of your veins into your heart.  Energy directed through the catheter eliminates the areas of your heart that are causing abnormal electric stimulation. HOME CARE INSTRUCTIONS  Take medicines only as directed by your health care provider.  Do not use caffeine in any form if caffeine triggers episodes of PSVT. Otherwise, consume caffeine in moderation. This means no more than a few cups of coffee or the equivalent each day.  Do not drink alcohol if alcohol triggers episodes of PSVT. Otherwise, limit alcohol intake to no more than 1 drink per day for nonpregnant women and 2 drinks per day for men. One drink equals 12 ounces of beer, 5 ounces of wine, or 1 ounces of hard liquor.  Do not use any tobacco products, including cigarettes, chewing tobacco, or electronic cigarettes. If you need help quitting, ask your health care provider.  Try to get at least 7 hours of sleep each night.  Find healthy ways to manage stress.  Perform vagus nerve stimulation as directed by your health care provider.  Maintain a healthy weight.  Get some exercise on most days. Ask your health care provider to suggest some good activities for you. SEEK MEDICAL CARE IF:  You are having episodes of PSVT more often, or they are lasting longer.  Vagus nerve stimulation is no longer helping.  You have new symptoms during an episode. SEEK IMMEDIATE MEDICAL CARE IF:  You have chest pain or trouble breathing.  You have an episode of PSVT that has lasted longer than 20 minutes.  You have passed out from an episode of PSVT. These symptoms may represent a serious problem that is an emergency. Do not wait to see if the symptoms will go away. Get medical help right away. Call your local emergency services (911 in the [place] Do not drive yourself to the hospital.   This  information is not intended to replace advice given to you by your health care provider. Make sure you discuss any questions you have with your health care provider.   Document Released: 09/30/2005 Document Revised: 10/21/2014 Document Reviewed: 03/10/2014 Elsevier Interactive Patient Education 2016 Elsevier Inc.  

## 2015-12-14 NOTE — ED Notes (Signed)
Pt arrived via EMS c/o SVT and palpitations intermittently since December, pt wearing monitor from cardiologist.

## 2015-12-14 NOTE — ED Provider Notes (Signed)
CSN: [id number]     Arrival date & time 12/14/15  2104 History   First MD Initiated Contact with Patient 12/14/15 2123     Chief Complaint  Patient presents with  . Palpitations  . Tachycardia     (Consider location/radiation/quality/duration/timing/severity/associated sxs/prior Treatment) Patient is a 25 y.o. female presenting with palpitations.  Palpitations Palpitations quality:  Fast Onset quality:  Sudden Duration:  20 minutes Timing:  Sporadic Progression:  Resolved Chronicity:  Recurrent Context comment:  Currently being worked up for recurrent SVTs by cardiology Relieved by: Self resolving. Exacerbated by: Unknown. Ineffective treatments:  None tried Associated symptoms: no back pain, no chest pain, no chest pressure, no cough, no diaphoresis, no dizziness, no lower extremity edema, no nausea, no near-syncope, no numbness, no shortness of breath, no syncope, no vomiting and no weakness   Risk factors: no hx of PE and no hyperthyroidism     Past Medical History  Diagnosis Date  . Asthma     mild intermittent  . Anxiety   . History of echocardiogram     Echo 2/17: EF 55-60%, no RWMA, normal diastolic function   Past Surgical History  Procedure Laterality Date  . None     Family History  Problem Relation Age of Onset  . Heart attack Father 12    deceased  . Diabetes Maternal Grandmother    Social History  Substance Use Topics  . Smoking status: Never Smoker   . Smokeless tobacco: None  . Alcohol Use: No   OB History    No data available     Review of Systems  Constitutional: Negative for fever, chills, diaphoresis, appetite change and fatigue.  HENT: Negative for congestion, ear pain, facial swelling, mouth sores and sore throat.   Eyes: Negative for visual disturbance.  Respiratory: Negative for cough, chest tightness and shortness of breath.   Cardiovascular: Positive for palpitations. Negative for chest pain, syncope and near-syncope.   Gastrointestinal: Negative for nausea, vomiting, abdominal pain, diarrhea and blood in stool.  Endocrine: Negative for cold intolerance and heat intolerance.  Genitourinary: Negative for frequency, decreased urine volume and difficulty urinating.  Musculoskeletal: Negative for back pain and neck stiffness.  Skin: Negative for rash.  Neurological: Negative for dizziness, weakness, light-headedness, numbness and headaches.  All other systems reviewed and are negative.     Allergies  Food  Home Medications   Prior to Admission medications   Medication Sig Start Date End Date Taking? Authorizing Provider  albuterol (PROVENTIL HFA;VENTOLIN HFA) 108 (90 Base) MCG/ACT inhaler Inhale 1-2 puffs into the lungs every 6 (six) hours as needed for wheezing or shortness of breath.   Yes Historical Provider, MD  terconazole (TERAZOL 3) 0.8 % vaginal cream Place 1 applicator vaginally daily as needed (yeast infection).   Yes Historical Provider, MD   BP 130/88 mmHg  Pulse 108  Temp(Src) 98.5 F (36.9 C) (Oral)  Resp 19  SpO2 100%  LMP 12/03/2015 Physical Exam  Constitutional: She is oriented to person, place, and time. She appears well-developed and well-nourished. No distress.  HENT:  Head: Normocephalic and atraumatic.  Right Ear: External ear normal.  Left Ear: External ear normal.  Nose: Nose normal.  Eyes: Conjunctivae and EOM are normal. Pupils are equal, round, and reactive to light. Right eye exhibits no discharge. Left eye exhibits no discharge. No scleral icterus.  Neck: Normal range of motion. Neck supple.  Cardiovascular: Normal rate, regular rhythm and normal heart sounds.  Exam reveals no gallop and  no friction rub.   No murmur heard. Pulmonary/Chest: Effort normal and breath sounds normal. No stridor. No respiratory distress. She has no wheezes.  Abdominal: Soft. She exhibits no distension. There is no tenderness.  Musculoskeletal: She exhibits no edema or tenderness.   Neurological: She is alert and oriented to person, place, and time.  Skin: Skin is warm and dry. No rash noted. She is not diaphoretic. No erythema.  Psychiatric: She has a normal mood and affect.    ED Course  Procedures (including critical care time) Labs Review Labs Reviewed - No data to display  Imaging Review No results found. I have personally reviewed and evaluated these images and lab results as part of my medical decision-making.   EKG Interpretation   Date/Time:  Thursday December 14 2015 21:18:04 EST Ventricular Rate:  98 PR Interval:  145 QRS Duration: 77 QT Interval:  326 QTC Calculation: 416 R Axis:   74 Text Interpretation:  Sinus rhythm No significant change since last  tracing Confirmed by [facility] MD, Barbara Cower [phone]) on 12/14/2015 9:37:55 PM      MDM   25 year old female being evaluated by cardiology for SVTs currently wearing a Holter monitor presents to the ED for palpitations lasting 20 minutes. Patient denied any nausea, chest pain, shortness of breath, dizziness. She does report that prior to the sensation she feels tingling in her hands and feet. She denies any recent fevers illnesses or infections. She reports being well hydrated. She does say that this past week she's been eating a lot more chocolate. No sexual activity in 5 years and is adamant that she is not pregnant. Rest of the history as above.   EKG with normal sinus rhythm. Monitor was interrogated and revealed SVT with a rate in the 170s. Patient monitored in the ED without recurrence of her SVT. Patient given instructions on Valsalva maneuvers.  He is safe for discharge with strict return precautions. Patient follow-up with PCP as needed. She is scheduled for a cardiology follow-up and was instructed to keep that appointment.  Final diagnoses:  SVT (supraventricular tachycardia) (HCC)        Drema Pry, MD 12/15/15 0100  Marily Memos, MD 12/18/15 [phone]

## 2015-12-15 MED ORDER — METOPROLOL TARTRATE 25 MG PO TABS: ORAL_TABLET | ORAL | Status: DC

## 2015-12-15 NOTE — Telephone Encounter (Addendum)
LM for pt to call to see how she is feeling and to notify her that Lilian ComaScott Weaver,PA wants to give her Metoprolol.Received a tracing from holter monitor from 3/2 at 7:15 CT. See in Epic that she was seen in ER last PM at 9:50.  Reviewed tracing with Tereso NewcomerScott Weaver, PA who wants her to be started on Metoprolol Tartrate 12.5 -25 mg Daily prn and to follow up with Dr. Ladona Ridgelaylor. Have sent request to Glynda JaegerMelissa Tatum to schedule her an appointment. 11:45  Got in touch with Ms Fraser Dinyivi.  She states she is feeling fine now.  Was walking when she had the symptoms of lightheadedness last PM.  Advised that Scott wanted to start her on Metoprolol to take when she had the palpitations. Advised to send to [facility]. Also advised her that someone would be calling her to set up an appointment with Dr. Ladona Ridgelaylor.  She verbalizes understanding.

## 2015-12-18 ENCOUNTER — Telehealth: Payer: Self-pay | Admitting: Physician Assistant

## 2015-12-18 NOTE — Telephone Encounter (Signed)
New message      Pt c/o medication issue:  1. Name of Medication: metoprolol tartrate 2. How are you currently taking this medication (dosage and times per day)? 25mg  3. Are you having a reaction (difficulty breathing--STAT)? no 4. What is your medication issue? Pt was reading about medication online and read where if you have asthma, you should make sure your physician know.  She want to make sure her chart indicates that she has asthma and it is ok to take this medication

## 2015-12-18 NOTE — Telephone Encounter (Signed)
I returned to call to pt who had concerns about her starting metoprolol tartrate and saw on the internet to let your dr know if you have asthma. I advised pt ok to take metoprolol and that we do have asthma noted on her chart. Advised pt to watch for increased sob after being on the metoprolol. I also verified metoprolol dose of 25 mg tablet with the directions to take 12.5 mg (1/2 tablet) daily or as needed for palpitations; may take 1 tablet (25 mg) if symptoms continue. Pt states she still feels palpitations at night. I advised she could take a whole tablet. I will also verify with Tereso NewcomerScott Weaver, PA that he ordered the metoprolol tartrate only once a day, this is usually a twice daily med though I will verify PA directions and let pt know. I will also see if he has any further recommendations since pt is still having symptoms at night . I told pt that I will call her back either later today or tomorrow to let her know of PA recommendations. Pt verbalized understanding to plan of care.

## 2015-12-19 NOTE — Telephone Encounter (Signed)
I did call pt today as well to let her know Bing NeighborsScott W. PA said metoprolol 12.5 mg-25 mg only AS NEEDED for the palpitations. Pt said ok and thank you.

## 2015-12-19 NOTE — Telephone Encounter (Signed)
I advised pt yesterday to make sure to only take the metoprolol as needed,

## 2015-12-19 NOTE — Telephone Encounter (Signed)
She should take Metoprolol Tartrate 12.5 mg to 25 mg once daily as needed for palpitations. Tereso NewcomerScott Jamontae Thwaites, PA-C   12/19/2015 5:40 PM

## 2015-12-25 ENCOUNTER — Encounter: Payer: Self-pay | Admitting: Internal Medicine

## 2015-12-25 ENCOUNTER — Ambulatory Visit (INDEPENDENT_AMBULATORY_CARE_PROVIDER_SITE_OTHER): Payer: PRIVATE HEALTH INSURANCE | Admitting: Internal Medicine

## 2015-12-25 VITALS — BP 88/60 | HR 70 | Ht 61.0 in | Wt 112.2 lb

## 2015-12-25 DIAGNOSIS — R002 Palpitations: Secondary | ICD-10-CM

## 2015-12-25 MED ORDER — METOPROLOL TARTRATE 25 MG PO TABS: ORAL_TABLET | ORAL | Status: DC

## 2015-12-25 NOTE — Patient Instructions (Addendum)
Medication Instructions:  Your physician has recommended you make the following change in your medication:  1) INCREASE Lopressor to 12.5 mg (half tablet) by mouth TWICE daily (and as needed for palpitations)   Labwork: none  Testing/Procedures: non3  Follow-Up: Your physician recommends that you schedule a follow-up appointment in: with Dr. Ladona Ridgelaylor in 6-8 weeks   Any Other Special Instructions Will Be Listed Below (If Applicable).     If you need a refill on your cardiac medications before your next appointment, please call your pharmacy.

## 2015-12-25 NOTE — Progress Notes (Signed)
HPI Brandy Pena is referred today by the ER for evaluation of palpitations. She is a pleasant 25 yo woman with palpitations who presents for additional evaluation. The patient has had her symptoms for a few months. They start suddenly, sometimes awakening her  From sleep. She will note that her heart will race for about 20 minutes and then gradually improve. She has stopped exercising. She notes sob with her heart racing. She wore a cardiac monitor and had lots of sinus tachycardia. She appeared to have a single episode of SVT at 178/min although I could not definitively exclude sinus tachy. She gets lightheaded with her episodes but has not passed out. Allergies  Allergen Reactions  . Food Other (See Comments)    TOFU-itching and rash     Current Outpatient Prescriptions  Medication Sig Dispense Refill  . albuterol (PROVENTIL HFA;VENTOLIN HFA) 108 (90 Base) MCG/ACT inhaler Inhale 1-2 puffs into the lungs every 6 (six) hours as needed for wheezing or shortness of breath.    . metoprolol tartrate (LOPRESSOR) 25 MG tablet Take 12.5 mg (1/2 tablet) daily or as needed for palpitations; may take 1 tablet (25 mg) if symptoms continue 45 tablet 11  . terconazole (TERAZOL 3) 0.8 % vaginal cream Place 1 applicator vaginally daily as needed (yeast infection).     No current facility-administered medications for this visit.     Past Medical History  Diagnosis Date  . Asthma     mild intermittent  . Anxiety   . History of echocardiogram     Echo 2/17: EF 55-60%, no RWMA, normal diastolic function    ROS:   All systems reviewed and negative except as noted in the HPI.   Past Surgical History  Procedure Laterality Date  . None       Family History  Problem Relation Age of Onset  . Heart attack Father 4960    deceased  . Diabetes Maternal Grandmother      Social History   Social History  . Marital Status: Single    Spouse Name: N/A  . Number of Children: N/A  . Years of  Education: N/A   Occupational History  . 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 Smoking status: Never Smoker   . Smokeless tobacco: Not on file  . Alcohol Use: No  . Drug Use: No  . Sexual Activity: Not on file   Other Topics Concern  . Not on file   Social History Narrative   Graduate of [employer] in 2015        BP 88/60 mmHg  Pulse 70  Ht 5\' 1"  (1.549 m)  Wt 112 lb 3.2 oz (50.894 kg)  BMI 21.21 kg/m2  LMP 12/03/2015  Physical Exam:  Well appearing 25 yo woman, NAD HEENT: Unremarkable Neck:  6 cm JVD, no thyromegally Lymphatics:  No adenopathy Back:  No CVA tenderness Lungs:  Clear with no wheezes HEART:  Regular rate rhythm, no murmurs, no rubs, no clicks Abd:  soft, positive bowel sounds, no organomegally, no rebound, no guarding Ext:  2 plus pulses, no edema, no cyanosis, no clubbing Skin:  No rashes no nodules Neuro:  CN II through XII intact, motor grossly intact  EKG - reviewed - NSR  Assess/Plan:  1. Palpitations - the etiology is still unclear. She has a single episode of what looks like SVT but multiple other slower episodes of  heart racing that are clearly sinus tachycardia. I have asked her to maintain a caffeine free diet, avoid ETOH, and start back exercising. She will take low dose metoprolol as tolerated. 2. Sinus tachy - she may have inappropriate sinus tachycardia. Will follow. 3. Anxiety - the patient notes significant anxiousness regarding her episodes. I have asked her to start back exercising and reassured her that her situation is benign.  Leonia Reeves.D.

## 2016-01-04 ENCOUNTER — Ambulatory Visit: Payer: PRIVATE HEALTH INSURANCE | Admitting: Physician Assistant

## 2016-02-13 ENCOUNTER — Encounter: Payer: Self-pay | Admitting: Internal Medicine

## 2016-02-13 ENCOUNTER — Ambulatory Visit (INDEPENDENT_AMBULATORY_CARE_PROVIDER_SITE_OTHER): Payer: PRIVATE HEALTH INSURANCE | Admitting: Internal Medicine

## 2016-02-13 VITALS — BP 98/68 | HR 80 | Ht 61.0 in | Wt 112.8 lb

## 2016-02-13 DIAGNOSIS — R002 Palpitations: Secondary | ICD-10-CM | POA: Diagnosis not present

## 2016-02-13 NOTE — Progress Notes (Signed)
      HPI Brandy Pena returns today for ongoing evaluation of palpitations. She has had documented increased heart rates on cardiac monitoring and I initially prescribed her a low dose of beta blocker. She has had fewer palpitations but thinks she is having trouble with her sleep on the beta blocker. There is also a question of a rash and she is pending dermatology evaluation. In the interim she has tried to reduce her caffeine intake.  Allergies  Allergen Reactions  . Food Other (See Comments)    TOFU-itching and rash     Current Outpatient Prescriptions  Medication Sig Dispense Refill  . albuterol (PROVENTIL HFA;VENTOLIN HFA) 108 (90 Base) MCG/ACT inhaler Inhale 1-2 puffs into the lungs every 6 (six) hours as needed for wheezing or shortness of breath.    . metoprolol tartrate (LOPRESSOR) 25 MG tablet Take 12.5 mg (1/2 tablet) TWICE daily or as needed for palpitations; may take 1 tablet (25 mg) if symptoms continue 45 tablet 11  . terconazole (TERAZOL 3) 0.8 % vaginal cream Place 1 applicator vaginally daily as needed (yeast infection).     No current facility-administered medications for this visit.     Past Medical History  Diagnosis Date  . Asthma     mild intermittent  . Anxiety   . History of echocardiogram     Echo 2/17: EF 55-60%, no RWMA, normal diastolic function    ROS:   All systems reviewed and negative except as noted in the HPI.   Past Surgical History  Procedure Laterality Date  . None       Family History  Problem Relation Age of Onset  . Heart attack Father 1560    deceased  . Diabetes Maternal Grandmother      Social History   Social History  . Marital Status: Single    Spouse Name: N/A  . Number of Children: N/A  . Years of Education: N/A   Occupational History  . 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 Smoking status: Never Smoker   . Smokeless tobacco: Not on file  . Alcohol Use: No  .  Drug Use: No  . Sexual Activity: Not on file   Other Topics Concern  . Not on file   Social History Narrative   Graduate of [employer] in 2015        BP 98/68 mmHg  Pulse 80  Ht 5\' 1"  (1.549 m)  Wt 112 lb 12.8 oz (51.166 kg)  BMI 21.32 kg/m2  SpO2 99%  Physical Exam:  Well appearing 25 yo woman, NAD HEENT: Unremarkable Neck:  No JVD, no thyromegally Lymphatics:  No adenopathy Back:  No CVA tenderness Lungs:  Clear with no wheezes HEART:  Regular rate rhythm, no murmurs, no rubs, no clicks Abd:  soft, positive bowel sounds, no organomegally, no rebound, no guarding Ext:  2 plus pulses, no edema, no cyanosis, no clubbing Skin:  No rashes no nodules Neuro:  CN II through XII intact, motor grossly intact   Assess/Plan: 1. Palpitations - she has had improvement in her symptoms on low dose metoprolol and reduction of caffeine intake 2. Asthma - I have warned her that her asthma treatment could make her heart race 3. Rash - she has non-erythematous palpules. She is pending derm appointment.  Leonia ReevesGregg Taylor,M.D.

## 2016-02-13 NOTE — Patient Instructions (Signed)

## 2016-05-07 ENCOUNTER — Ambulatory Visit (INDEPENDENT_AMBULATORY_CARE_PROVIDER_SITE_OTHER): Payer: PRIVATE HEALTH INSURANCE | Admitting: Internal Medicine

## 2016-05-07 ENCOUNTER — Encounter: Payer: Self-pay | Admitting: Internal Medicine

## 2016-05-07 VITALS — BP 108/68 | HR 54 | Temp 98.5°F | Resp 18 | Ht 61.0 in | Wt 106.0 lb

## 2016-05-07 DIAGNOSIS — I471 Supraventricular tachycardia, unspecified: Secondary | ICD-10-CM | POA: Insufficient documentation

## 2016-05-07 DIAGNOSIS — J4520 Mild intermittent asthma, uncomplicated: Secondary | ICD-10-CM

## 2016-05-07 DIAGNOSIS — M25552 Pain in left hip: Secondary | ICD-10-CM | POA: Diagnosis not present

## 2016-05-07 DIAGNOSIS — L309 Dermatitis, unspecified: Secondary | ICD-10-CM | POA: Diagnosis not present

## 2016-05-07 MED ORDER — ZINC SULFATE 66 MG PO TABS: ORAL_TABLET | ORAL | 0 refills | Status: DC

## 2016-05-07 MED ORDER — ALBUTEROL SULFATE HFA 108 (90 BASE) MCG/ACT IN AERS: 1.0000 | INHALATION_SPRAY | Freq: Four times a day (QID) | RESPIRATORY_TRACT | 11 refills | Status: DC | PRN

## 2016-05-07 MED ORDER — BIOTIN 1 MG PO CAPS: ORAL_CAPSULE | ORAL | Status: DC

## 2016-05-07 NOTE — Patient Instructions (Signed)
   Medications reviewed and updated.  No changes recommended at this time.  Your prescription(s) have been submitted to your pharmacy. Please take as directed and contact our office if you believe you are having problem(s) with the medication(s).  A referral was ordered for sports medicine, Dr Katrinka Blazing for your left hip.   Please follow up annually for a physical

## 2016-05-07 NOTE — Progress Notes (Signed)
Subjective:    Patient ID: Brandy Pena, female    DOB: 07/25/1991, 25 y.o.   MRN: [id number]  HPI She is here to establish with a new pcp.    Asthma:  She has had asthma since she was a child.  She thinks her main triggers are URI's and allergies.  She has not needed her albuterol in a few months, but wants to have one on hand in case she needs it.  She denies cough, wheeze and sob.   SVT:  She has had ongoing palpitations and was diagnosed with SVT earlier this year.  She has seen cardiology and was placed on low dose metoprolol.  She has had some chest pain since starting the medication.  She has had some palpitations since taking the medication, but the medication is working.   Left hip pain:  She has occasional left hip pain that can be sharp in nature.  She did go to the ED at one point and an xray was normal.  She wonders if it is related to how she is sleeping.   Eczema:  She follows with dermatology.   Medications and allergies reviewed with patient and updated if appropriate.  Patient Active Problem List   Diagnosis Date Noted  . Mild intermittent asthma   . Anxiety     Current Outpatient Prescriptions on File Prior to Visit  Medication Sig Dispense Refill  . albuterol (PROVENTIL HFA;VENTOLIN HFA) 108 (90 Base) MCG/ACT inhaler Inhale 1-2 puffs into the lungs every 6 (six) hours as needed for wheezing or shortness of breath.    . metoprolol tartrate (LOPRESSOR) 25 MG tablet Take 12.5 mg (1/2 tablet) TWICE daily or as needed for palpitations; may take 1 tablet (25 mg) if symptoms continue 45 tablet 11  . terconazole (TERAZOL 3) 0.8 % vaginal cream Place 1 applicator vaginally daily as needed (yeast infection).     No current facility-administered medications on file prior to visit.     Past Medical History:  Diagnosis Date  . Anxiety   . Asthma    mild intermittent  . History of echocardiogram    Echo 2/17: EF 55-60%, no RWMA, normal diastolic function    Past  Surgical History:  Procedure Laterality Date  . NONE      Social History   Social History  . Marital status: Single    Spouse name: N/A  . Number of children: N/A  . Years of education: N/A   Occupational History  . 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 Smoking status: Never Smoker  . Smokeless tobacco: Not on file  . Alcohol use No  . Drug use: No  . Sexual activity: Not on file   Other Topics Concern  . Not on file   Social History Narrative   Graduate of [employer] in 2015       Family History  Problem Relation Age of Onset  . Heart attack Father 100    deceased  . Diabetes Maternal Grandmother     Review of Systems  Constitutional: Negative for activity change, chills, fever and unexpected weight change.  Eyes: Negative for visual disturbance.  Respiratory: Negative for cough, shortness of breath and wheezing.   Cardiovascular: Positive for chest pain (after she started the metoprolol) and palpitations. Negative for leg swelling.  Gastrointestinal: Negative for abdominal pain, blood in stool, constipation, diarrhea and nausea.  Genitourinary: Negative for  dysuria and hematuria.  Musculoskeletal: Positive for arthralgias (left hip). Negative for back pain.  Neurological: Negative for dizziness, light-headedness and headaches.  Psychiatric/Behavioral: Negative for dysphoric mood. The patient is not nervous/anxious.        Objective:   Vitals:   05/07/16 1446  BP: 108/68  Pulse: (!) 54  Resp: 18  Temp: 98.5 F (36.9 C)   Filed Weights   05/07/16 1446  Weight: 106 lb (48.1 kg)   Body mass index is 20.03 kg/m.   Physical Exam Constitutional: She appears well-developed and well-nourished. No distress.  HENT:  Head: Normocephalic and atraumatic.  Right Ear: External ear normal. Normal ear canal and TM Left Ear: External ear normal.  Normal ear canal and TM Mouth/Throat: Oropharynx is clear and  moist.  Eyes: Conjunctivae and EOM are normal.  Neck: Neck supple. No tracheal deviation present. No thyromegaly present.  No carotid bruit  Cardiovascular: Normal rate, regular rhythm and normal heart sounds.   No murmur heard.  No edema. Pulmonary/Chest: Effort normal and breath sounds normal. No respiratory distress. She has no wheezes. She has no rales.  Abdominal: Soft. She exhibits no distension. There is no tenderness.  Lymphadenopathy: She has no cervical adenopathy.  Skin: Skin is warm and dry. She is not diaphoretic.  Psychiatric: She has a normal mood and affect. Her behavior is normal.         Assessment & Plan:   See Problem List for Assessment and Plan of chronic medical problems.  F/u annually for a CPE

## 2016-05-07 NOTE — Assessment & Plan Note (Signed)
Follows with derm

## 2016-05-07 NOTE — Assessment & Plan Note (Signed)
Taking metoprolol 12.  5 mg once a day SVT controlled Will follow up with cardiology

## 2016-05-07 NOTE — Assessment & Plan Note (Signed)
Will refer to dr Katrinka Blazing for further eval and treatment

## 2016-05-07 NOTE — Assessment & Plan Note (Signed)
Uses albuterol as needed - last used it months ago

## 2016-05-07 NOTE — Progress Notes (Signed)
Pre visit review using our clinic review tool, if applicable. No additional management support is needed unless otherwise documented below in the visit note. 

## 2016-05-16 NOTE — Progress Notes (Signed)
[facility] [address] [place], [place] [address] Phone: [phone] Subjective:    I'm seeing this patient by the request  of:  Pincus Sanes, MD   CC: left hip pain   [id number]  Brandy Pena is a 25 y.o. female coming in with complaint of left hip Pain. Has been intermittent for years. Seems to be on the posterior aspect. Happens when she is stretching sometimes. Walking up and down hill seems to aggravate it. Denies any numbness. Some mild radiation down the leg. Still intermittent. Does work out on a regular basis. Denies any injury but patient was in a collegiate track athlete.   Xray 2013 reviewed and independently visualized by me.  Negative for bony abnormality.   Past Medical History:  Diagnosis Date  . Anxiety   . Asthma    mild intermittent  . History of echocardiogram    Echo 2/17: EF 55-60%, no RWMA, normal diastolic function   Past Surgical History:  Procedure Laterality Date  . NONE     Social History   Social History  . Marital status: Single    Spouse name: N/A  . Number of children: N/A  . Years of education: N/A   Occupational History  . 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 Smoking status: Never Smoker  . Smokeless tobacco: Never Used  . Alcohol use No  . Drug use: No  . Sexual activity: Not Asked   Other Topics Concern  . None   Social History [employer] in 2015.  Administrator, arts - HS.       Exercise: regular      Allergies  Allergen Reactions  . Food Other (See Comments)    TOFU-itching and rash   Family History  Problem Relation Age of Onset  . Heart attack Father 56    deceased  . Diabetes Maternal Grandmother     Past medical history, social, surgical and family history all reviewed in electronic medical record.  No pertanent information unless stated regarding to the chief complaint.   Review of Systems: No  headache, visual changes, nausea, vomiting, diarrhea, constipation, dizziness, abdominal pain, skin rash, fevers, chills, night sweats, weight loss, swollen lymph nodes, body aches, joint swelling, muscle aches, chest pain, shortness of breath, mood changes.   Objective  Blood pressure 92/64, pulse 61, height 5\' 1"  (1.549 m), weight 109 lb (49.4 kg), SpO2 98 %.  General: No apparent distress alert and oriented x3 mood and affect normal, dressed appropriately.  HEENT: Pupils equal, extraocular movements intact  Respiratory: Patient's speak in full sentences and does not appear short of breath  Cardiovascular: No lower extremity edema, non tender, no erythema  Skin: Warm dry intact with no signs of infection or rash on extremities or on axial skeleton.  Abdomen: Soft nontender  Neuro: Cranial nerves II through XII are intact, neurovascularly intact in all extremities with 2+ DTRs and 2+ pulses.  Lymph: No lymphadenopathy of posterior or anterior cervical chain or axillae bilaterally.  Gait normal with good balance and coordination.  MSK:  Non tender with full range of motion and good stability and symmetric strength and tone of shoulders, elbows, wrist, , knee and ankles bilaterally.  Hip: Left ROM IR: 15 Deg, ER: 25 Deg, Flexion: 120 Deg, Extension: 100 Deg, Abduction: 45 Deg, Adduction: 45 Deg Strength IR: 5/5, ER: 5/5, Flexion:  5/5, Extension: 5/5, Abduction: 5/5, Adduction: 5/5 Pelvic alignment unremarkable to inspection and palpation. Standing hip rotation and gait without trendelenburg sign / unsteadiness. Greater trochanter without tenderness to palpation. No tenderness over piriformis and greater trochanter. No pain with FABER or FADIR. No SI joint tenderness and normal minimal SI movement. Tightness noted of the hamstring on the left side compared to the contralateral side. Patient does have pain with forward flexion of the back. Patient's pain seems to be right at the ischial  tuberosity.   Impression and Recommendations:     This case required medical decision making of moderate complexity.      Note: This dictation was prepared with Dragon dictation along with smaller phrase technology. Any transcriptional errors that result from this process are unintentional.

## 2016-05-17 ENCOUNTER — Ambulatory Visit (INDEPENDENT_AMBULATORY_CARE_PROVIDER_SITE_OTHER): Payer: PRIVATE HEALTH INSURANCE | Admitting: Family Medicine

## 2016-05-17 ENCOUNTER — Encounter: Payer: Self-pay | Admitting: Family Medicine

## 2016-05-17 DIAGNOSIS — M7072 Other bursitis of hip, left hip: Secondary | ICD-10-CM | POA: Diagnosis not present

## 2016-05-17 DIAGNOSIS — M6580 Other synovitis and tenosynovitis, unspecified site: Secondary | ICD-10-CM

## 2016-05-17 DIAGNOSIS — M76899 Other specified enthesopathies of unspecified lower limb, excluding foot: Secondary | ICD-10-CM | POA: Insufficient documentation

## 2016-05-17 MED ORDER — DICLOFENAC SODIUM 2 % TD SOLN: TRANSDERMAL | 3 refills | Status: DC

## 2016-05-17 NOTE — Patient Instructions (Signed)
Good to see you You have hamstring tendonitis and mild bursitis.  Ice 20 minutes 2 times daily. Usually after activity and before bed. Exercises 3 times a week.  Compression sleeve or compression shorts with activity  pennsaid pinkie amount topically 2 times daily as needed.  Duexis 3 times a day for 3 days.  Better shoes can help as well.  See me again in 4 weeks.

## 2016-05-17 NOTE — Assessment & Plan Note (Signed)
Patient given home exercises, compression, we discussed which activities to do an which was to avoid. Patient and will come back and see me again in 3-4 weeks. Patient continued to have difficulty will consider nitroglycerin, formal physical therapy, and we will ultrasound for further evaluation.

## 2016-05-17 NOTE — Assessment & Plan Note (Signed)
Patient doesn't more of an ischial bursitis. We discussed icing regimen and home exercises. We discussed which activities doing which was potentially avoid. We discussed avoiding certain activities. Patient given home exercises and we discussed compression. Topical anti-inflammatories given. Then follow-up of continued have pain we'll consider ultrasound with possible injections or possible nitroglycerin patches.

## 2016-05-26 NOTE — Progress Notes (Deleted)
[facility] [address] [place], [place] [address] Phone: [phone] Subjective:    I'm seeing this patient by the request  of:  Pincus SanesStacy J Burns, MD   CC: left hip pain f/u  BJY:[id number]:Subjective  Brandy GallowayJessica Pena is a 25 y.o. female coming in with complaint of left hip Pain. Has been intermittent for years. Seems to be on the posterior aspect. Happens when she is stretching sometimes. Walking up and down hill seems to aggravate it. Denies any numbness. Some mild radiation down the leg. Still intermittent. Does work out on a regular basis. Denies any injury but patient was in a collegiate track athlete.   Xray 2013 reviewed and independently visualized by me.  Negative for bony abnormality.   Past Medical History:  Diagnosis Date  . Anxiety   . Asthma    mild intermittent  . History of echocardiogram    Echo 2/17: EF 55-60%, no RWMA, normal diastolic function   Past Surgical History:  Procedure Laterality Date  . NONE     Social History   Social History  . Marital status: Single    Spouse name: N/A  . Number of children: N/A  . Years of education: N/A   Occupational History  . 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 Smoking status: Never Smoker  . Smokeless tobacco: Never Used  . Alcohol use No  . Drug use: No  . Sexual activity: Not on file   Other Topics Concern  . Not on file   Social History Narrative   Graduate of [employer] in 2015.  Administrator, artscience teacher - HS.       Exercise: regular      Allergies  Allergen Reactions  . Food Other (See Comments)    TOFU-itching and rash   Family History  Problem Relation Age of Onset  . Heart attack Father 1560    deceased  . Diabetes Maternal Grandmother     Past medical history, social, surgical and family history all reviewed in electronic medical record.  No pertanent information unless stated regarding to the chief complaint.   Review of  Systems: No headache, visual changes, nausea, vomiting, diarrhea, constipation, dizziness, abdominal pain, skin rash, fevers, chills, night sweats, weight loss, swollen lymph nodes, body aches, joint swelling, muscle aches, chest pain, shortness of breath, mood changes.   Objective  There were no vitals taken for this visit.  General: No apparent distress alert and oriented x3 mood and affect normal, dressed appropriately.  HEENT: Pupils equal, extraocular movements intact  Respiratory: Patient's speak in full sentences and does not appear short of breath  Cardiovascular: No lower extremity edema, non tender, no erythema  Skin: Warm dry intact with no signs of infection or rash on extremities or on axial skeleton.  Abdomen: Soft nontender  Neuro: Cranial nerves II through XII are intact, neurovascularly intact in all extremities with 2+ DTRs and 2+ pulses.  Lymph: No lymphadenopathy of posterior or anterior cervical chain or axillae bilaterally.  Gait normal with good balance and coordination.  MSK:  Non tender with full range of motion and good stability and symmetric strength and tone of shoulders, elbows, wrist, , knee and ankles bilaterally.  Hip: Left ROM IR: 15 Deg, ER: 25 Deg, Flexion: 120 Deg, Extension: 100 Deg, Abduction: 45 Deg, Adduction: 45 Deg Strength IR: 5/5, ER: 5/5, Flexion: 5/5, Extension: 5/5, Abduction: 5/5, Adduction: 5/5  Pelvic alignment unremarkable to inspection and palpation. Standing hip rotation and gait without trendelenburg sign / unsteadiness. Greater trochanter without tenderness to palpation. No tenderness over piriformis and greater trochanter. No pain with FABER or FADIR. No SI joint tenderness and normal minimal SI movement. Tightness noted of the hamstring on the left side compared to the contralateral side. Patient does have pain with forward flexion of the back. Patient's pain seems to be right at the ischial tuberosity.   Impression and  Recommendations:     This case required medical decision making of moderate complexity.      Note: This dictation was prepared with Dragon dictation along with smaller phrase technology. Any transcriptional errors that result from this process are unintentional.

## 2016-05-27 ENCOUNTER — Ambulatory Visit: Payer: PRIVATE HEALTH INSURANCE | Admitting: Family Medicine

## 2016-06-10 ENCOUNTER — Ambulatory Visit: Payer: PRIVATE HEALTH INSURANCE | Admitting: Family Medicine

## 2016-06-24 ENCOUNTER — Encounter: Payer: Self-pay | Admitting: Internal Medicine

## 2016-06-24 ENCOUNTER — Ambulatory Visit (INDEPENDENT_AMBULATORY_CARE_PROVIDER_SITE_OTHER): Payer: PRIVATE HEALTH INSURANCE | Admitting: Internal Medicine

## 2016-06-24 ENCOUNTER — Other Ambulatory Visit (INDEPENDENT_AMBULATORY_CARE_PROVIDER_SITE_OTHER): Payer: PRIVATE HEALTH INSURANCE

## 2016-06-24 VITALS — BP 98/60 | HR 66 | Temp 98.6°F | Resp 16 | Ht 61.0 in | Wt 107.0 lb

## 2016-06-24 DIAGNOSIS — Z23 Encounter for immunization: Secondary | ICD-10-CM

## 2016-06-24 DIAGNOSIS — Z0000 Encounter for general adult medical examination without abnormal findings: Secondary | ICD-10-CM

## 2016-06-24 DIAGNOSIS — I471 Supraventricular tachycardia: Secondary | ICD-10-CM | POA: Diagnosis not present

## 2016-06-24 DIAGNOSIS — J4520 Mild intermittent asthma, uncomplicated: Secondary | ICD-10-CM | POA: Diagnosis not present

## 2016-06-24 LAB — LIPID PANEL
CHOLESTEROL: 150 mg/dL (ref 0–200)
HDL: 55.6 mg/dL (ref >39.00)
LDL Cholesterol: 83 mg/dL (ref 0–99)
NonHDL: 94.02
TRIGLYCERIDES: 54 mg/dL (ref 0.0–149.0)
Total CHOL/HDL Ratio: 3
VLDL: 10.8 mg/dL (ref 0.0–40.0)

## 2016-06-24 LAB — CBC WITH DIFFERENTIAL/PLATELET
BASOS ABS: 0 10*3/uL (ref 0.0–0.1)
Basophils Relative: 0.6 % (ref 0.0–3.0)
EOS PCT: 5.7 % — AB (ref 0.0–5.0)
Eosinophils Absolute: 0.4 10*3/uL (ref 0.0–0.7)
HEMATOCRIT: 30.3 % — AB (ref 36.0–46.0)
Hemoglobin: 10.3 g/dL — ABNORMAL LOW (ref 12.0–15.0)
LYMPHS ABS: 2.9 10*3/uL (ref 0.7–4.0)
LYMPHS PCT: 45.5 % (ref 12.0–46.0)
MCHC: 34 g/dL (ref 30.0–36.0)
MCV: 85.9 fl (ref 78.0–100.0)
MONOS PCT: 6.7 % (ref 3.0–12.0)
Monocytes Absolute: 0.4 10*3/uL (ref 0.1–1.0)
NEUTROS ABS: 2.6 10*3/uL (ref 1.4–7.7)
Neutrophils Relative %: 41.5 % — ABNORMAL LOW (ref 43.0–77.0)
PLATELETS: 326 10*3/uL (ref 150.0–400.0)
RBC: 3.52 Mil/uL — ABNORMAL LOW (ref 3.87–5.11)
RDW: 14.3 % (ref 11.5–15.5)
WBC: 6.4 10*3/uL (ref 4.0–10.5)

## 2016-06-24 LAB — COMPREHENSIVE METABOLIC PANEL
ALK PHOS: 50 U/L (ref 39–117)
ALT: 8 U/L (ref 0–35)
AST: 17 U/L (ref 0–37)
Albumin: 4.5 g/dL (ref 3.5–5.2)
BILIRUBIN TOTAL: 0.3 mg/dL (ref 0.2–1.2)
BUN: 9 mg/dL (ref 6–23)
CALCIUM: 9 mg/dL (ref 8.4–10.5)
CO2: 27 meq/L (ref 19–32)
Chloride: 104 mEq/L (ref 96–112)
Creatinine, Ser: 0.76 mg/dL (ref 0.40–1.20)
GFR: 117.65 mL/min (ref >60.00)
Glucose, Bld: 86 mg/dL (ref 70–99)
POTASSIUM: 3.7 meq/L (ref 3.5–5.1)
Sodium: 135 mEq/L (ref 135–145)
TOTAL PROTEIN: 7.8 g/dL (ref 6.0–8.3)

## 2016-06-24 LAB — TSH: TSH: 0.95 u[IU]/mL (ref 0.35–4.50)

## 2016-06-24 NOTE — Assessment & Plan Note (Signed)
Controlled.  Albuterol as needed ?

## 2016-06-24 NOTE — Addendum Note (Signed)
Addended by: Zenovia JordanMITCHELL, Ariann Khaimov B on: 06/24/2016 03:17 PM   Modules accepted: Orders

## 2016-06-24 NOTE — Assessment & Plan Note (Signed)
Occasional SVT/palpitations Does not drink caffeine or eat chocolate Metoprolol as needed - tries to avoid because it makes her not feel well

## 2016-06-24 NOTE — Patient Instructions (Signed)
Test(s) ordered today. Your results will be released to [place] (or called to you) after review, usually within 72hours after test completion. If any changes need to be made, you will be notified at that same time.  All other Health Maintenance issues reviewed.   All recommended immunizations and age-appropriate screenings are up-to-date or discussed.  tdap (tetanus) vaccine administered today.   Medications reviewed and updated.  No changes recommended at this time.   Please followup in one year   Health Maintenance, Female Adopting a healthy lifestyle and getting preventive care can go a long way to promote health and wellness. Talk with your health care provider about what schedule of regular examinations is right for you. This is a good chance for you to check in with your provider about disease prevention and staying healthy. In between checkups, there are plenty of things you can do on your own. Experts have done a lot of research about which lifestyle changes and preventive measures are most likely to keep you healthy. Ask your health care provider for more information. WEIGHT AND DIET  Eat a healthy diet  Be sure to include plenty of vegetables, fruits, low-fat dairy products, and lean protein.  Do not eat a lot of foods high in solid fats, added sugars, or salt.  Get regular exercise. This is one of the most important things you can do for your health.  Most adults should exercise for at least 150 minutes each week. The exercise should increase your heart rate and make you sweat (moderate-intensity exercise).  Most adults should also do strengthening exercises at least twice a week. This is in addition to the moderate-intensity exercise.  Maintain a healthy weight  Body mass index (BMI) is a measurement that can be used to identify possible weight problems. It estimates body fat based on height and weight. Your health care provider can help determine your BMI and help you  achieve or maintain a healthy weight.  For females 83 years of age and older:   A BMI below 18.5 is considered underweight.  A BMI of 18.5 to 24.9 is normal.  A BMI of 25 to 29.9 is considered overweight.  A BMI of 30 and above is considered obese.  Watch levels of cholesterol and blood lipids  You should start having your blood tested for lipids and cholesterol at 27 years of age, then have this test every 5 years.  You may need to have your cholesterol levels checked more often if:  Your lipid or cholesterol levels are high.  You are older than 27 years of age.  You are at high risk for heart disease.  CANCER SCREENING   Lung Cancer  Lung cancer screening is recommended for adults 12-107 years old who are at high risk for lung cancer because of a history of smoking.  A yearly low-dose CT scan of the lungs is recommended for people who:  Currently smoke.  Have quit within the past 15 years.  Have at least a 30-pack-year history of smoking. A pack year is smoking an average of one pack of cigarettes a day for 1 year.  Yearly screening should continue until it has been 15 years since you quit.  Yearly screening should stop if you develop a health problem that would prevent you from having lung cancer treatment.  Breast Cancer  Practice breast self-awareness. This means understanding how your breasts normally appear and feel.  It also means doing regular breast self-exams. Let your health care  provider know about any changes, no matter how small.  If you are in your 20s or 30s, you should have a clinical breast exam (CBE) by a health care provider every 1-3 years as part of a regular health exam.  If you are 52 or older, have a CBE every year. Also consider having a breast X-ray (mammogram) every year.  If you have a family history of breast cancer, talk to your health care provider about genetic screening.  If you are at high risk for breast cancer, talk to your  health care provider about having an MRI and a mammogram every year.  Breast cancer gene (BRCA) assessment is recommended for women who have family members with BRCA-related cancers. BRCA-related cancers include:  Breast.  Ovarian.  Tubal.  Peritoneal cancers.  Results of the assessment will determine the need for genetic counseling and BRCA1 and BRCA2 testing. Cervical Cancer Your health care provider may recommend that you be screened regularly for cancer of the pelvic organs (ovaries, uterus, and vagina). This screening involves a pelvic examination, including checking for microscopic changes to the surface of your cervix (Pap test). You may be encouraged to have this screening done every 3 years, beginning at age 30.  For women ages 72-65, health care providers may recommend pelvic exams and Pap testing every 3 years, or they may recommend the Pap and pelvic exam, combined with testing for human papilloma virus (HPV), every 5 years. Some types of HPV increase your risk of cervical cancer. Testing for HPV may also be done on women of any age with unclear Pap test results.  Other health care providers may not recommend any screening for nonpregnant women who are considered low risk for pelvic cancer and who do not have symptoms. Ask your health care provider if a screening pelvic exam is right for you.  If you have had past treatment for cervical cancer or a condition that could lead to cancer, you need Pap tests and screening for cancer for at least 20 years after your treatment. If Pap tests have been discontinued, your risk factors (such as having a new sexual partner) need to be reassessed to determine if screening should resume. Some women have medical problems that increase the chance of getting cervical cancer. In these cases, your health care provider may recommend more frequent screening and Pap tests. Colorectal Cancer  This type of cancer can be detected and often  prevented.  Routine colorectal cancer screening usually begins at 27 years of age and continues through 27 years of age.  Your health care provider may recommend screening at an earlier age if you have risk factors for colon cancer.  Your health care provider may also recommend using home test kits to check for hidden blood in the stool.  A small camera at the end of a tube can be used to examine your colon directly (sigmoidoscopy or colonoscopy). This is done to check for the earliest forms of colorectal cancer.  Routine screening usually begins at age 72.  Direct examination of the colon should be repeated every 5-10 years through 27 years of age. However, you may need to be screened more often if early forms of precancerous polyps or small growths are found. Skin Cancer  Check your skin from head to toe regularly.  Tell your health care provider about any new moles or changes in moles, especially if there is a change in a mole's shape or color.  Also tell your health care provider  care provider if you have a mole that is larger than the size of a pencil eraser.  Always use sunscreen. Apply sunscreen liberally and repeatedly throughout the day.  Protect yourself by wearing long sleeves, pants, a wide-brimmed hat, and sunglasses whenever you are outside. HEART DISEASE, DIABETES, AND HIGH BLOOD PRESSURE   High blood pressure causes heart disease and increases the risk of stroke. High blood pressure is more likely to develop in:  People who have blood pressure in the high end of the normal range (130-139/85-89 mm Hg).  People who are overweight or obese.  People who are African American.  If you are 18-39 years of age, have your blood pressure checked every 3-5 years. If you are 40 years of age or older, have your blood pressure checked every year. You should have your blood pressure measured twice--once when you are at a hospital or clinic, and once when you are not at a hospital or clinic.  Record the average of the two measurements. To check your blood pressure when you are not at a hospital or clinic, you can use:  An automated blood pressure machine at a pharmacy.  A home blood pressure monitor.  If you are between 55 years and 79 years old, ask your health care provider if you should take aspirin to prevent strokes.  Have regular diabetes screenings. This involves taking a blood sample to check your fasting blood sugar level.  If you are at a normal weight and have a low risk for diabetes, have this test once every three years after 27 years of age.  If you are overweight and have a high risk for diabetes, consider being tested at a younger age or more often. PREVENTING INFECTION  Hepatitis B  If you have a higher risk for hepatitis B, you should be screened for this virus. You are considered at high risk for hepatitis B if:  You were born in a country where hepatitis B is common. Ask your health care provider which countries are considered high risk.  Your parents were born in a high-risk country, and you have not been immunized against hepatitis B (hepatitis B vaccine).  You have HIV or AIDS.  You use needles to inject street drugs.  You live with someone who has hepatitis B.  You have had sex with someone who has hepatitis B.  You get hemodialysis treatment.  You take certain medicines for conditions, including cancer, organ transplantation, and autoimmune conditions. Hepatitis C  Blood testing is recommended for:  Everyone born from 1945 through 1965.  Anyone with known risk factors for hepatitis C. Sexually transmitted infections (STIs)  You should be screened for sexually transmitted infections (STIs) including gonorrhea and chlamydia if:  You are sexually active and are younger than 27 years of age.  You are older than 27 years of age and your health care provider tells you that you are at risk for this type of infection.  Your sexual activity  has changed since you were last screened and you are at an increased risk for chlamydia or gonorrhea. Ask your health care provider if you are at risk.  If you do not have HIV, but are at risk, it may be recommended that you take a prescription medicine daily to prevent HIV infection. This is called pre-exposure prophylaxis (PrEP). You are considered at risk if:  You are sexually active and do not regularly use condoms or know the HIV status of your partner(s).  You take   injection.  You are sexually active with a partner who has HIV. Talk with your health care provider about whether you are at high risk of being infected with HIV. If you choose to begin PrEP, you should first be tested for HIV. You should then be tested every 3 months for as long as you are taking PrEP.  PREGNANCY   If you are premenopausal and you may become pregnant, ask your health care provider about preconception counseling.  If you may become pregnant, take 400 to 800 micrograms (mcg) of folic acid every day.  If you want to prevent pregnancy, talk to your health care provider about birth control (contraception). OSTEOPOROSIS AND MENOPAUSE   Osteoporosis is a disease in which the bones lose minerals and strength with aging. This can result in serious bone fractures. Your risk for osteoporosis can be identified using a bone density scan.  If you are 60 years of age or older, or if you are at risk for osteoporosis and fractures, ask your health care provider if you should be screened.  Ask your health care provider whether you should take a calcium or vitamin D supplement to lower your risk for osteoporosis.  Menopause may have certain physical symptoms and risks.  Hormone replacement therapy may reduce some of these symptoms and risks. Talk to your health care provider about whether hormone replacement therapy is right for you.  HOME CARE INSTRUCTIONS   Schedule regular health, dental, and eye  exams.  Stay current with your immunizations.   Do not use any tobacco products including cigarettes, chewing tobacco, or electronic cigarettes.  If you are pregnant, do not drink alcohol.  If you are breastfeeding, limit how much and how often you drink alcohol.  Limit alcohol intake to no more than 1 drink per day for nonpregnant women. One drink equals 12 ounces of beer, 5 ounces of wine, or 1 ounces of hard liquor.  Do not use street drugs.  Do not share needles.  Ask your health care provider for help if you need support or information about quitting drugs.  Tell your health care provider if you often feel depressed.  Tell your health care provider if you have ever been abused or do not feel safe at home.   This information is not intended to replace advice given to you by your health care provider. Make sure you discuss any questions you have with your health care provider.   Document Released: 04/15/2011 Document Revised: 10/21/2014 Document Reviewed: 09/01/2013 Elsevier Interactive Patient Education [employer].

## 2016-06-24 NOTE — Progress Notes (Signed)
Pre visit review using our clinic review tool, if applicable. No additional management support is needed unless otherwise documented below in the visit note. 

## 2016-06-24 NOTE — Progress Notes (Signed)
Subjective:    Patient ID: Brandy Pena, female    DOB: [birth year], 25 y.o.   MRN: [id number]  HPI She is here for a physical exam.   She denies any changes to her health or famly health.  She denies concerns.    Her asthma is controlled.  She uses th albuterol as needed only.   Medications and allergies reviewed with patient and updated if appropriate.  Patient Active Problem List   Diagnosis Date Noted  . Ischial bursitis of left side 05/17/2016  . Hamstring tendinitis at origin 05/17/2016  . SVT (supraventricular tachycardia) (HCC) 05/07/2016  . Eczema 05/07/2016  . Left hip pain 05/07/2016  . Mild intermittent asthma     Current Outpatient Prescriptions on File Prior to Visit  Medication Sig Dispense Refill  . albuterol (PROVENTIL HFA;VENTOLIN HFA) 108 (90 Base) MCG/ACT inhaler Inhale 1-2 puffs into the lungs every 6 (six) hours as needed for wheezing or shortness of breath. 1 Inhaler 11  . Biotin 1 MG CAPS Takes daily    . Diclofenac Sodium (PENNSAID) 2 % SOLN Apply 1 pump twice daily. 112 g 3  . FLUOCINOLONE ACETONIDE SCALP 0.01 % OIL     . hydrocortisone 2.5 % cream     . ketoconazole (NIZORAL) 2 % shampoo     . metoprolol tartrate (LOPRESSOR) 25 MG tablet Take 12.5 mg (1/2 tablet) TWICE daily or as needed for palpitations; may take 1 tablet (25 mg) if symptoms continue 45 tablet 11  . terconazole (TERAZOL 3) 0.8 % vaginal cream Place 1 applicator vaginally daily as needed (yeast infection).    . Zinc Sulfate (ZINC 15) 66 MG TABS Takes daily  0   No current facility-administered medications on file prior to visit.     Past Medical History:  Diagnosis Date  . Anxiety   . Asthma    mild intermittent  . History of echocardiogram    Echo 2/17: EF 55-60%, no RWMA, normal diastolic function    Past Surgical History:  Procedure Laterality Date  . NONE      Social History   Social History  . Marital status: Single    Spouse name: N/A  . Number of  children: N/A  . Years of education: N/A   Occupational History  . 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 Smoking status: Never Smoker  . Smokeless tobacco: Never Used  . Alcohol use No  . Drug use: No  . Sexual activity: Not Asked   Other Topics Concern  . None   Social History [employer] in 2015.  Administrator, arts - HS.       Exercise: regular       Family History  Problem Relation Age of Onset  . Heart attack Father 27    deceased  . Diabetes Maternal Grandmother     Review of Systems  Constitutional: Negative for appetite change, chills, fever and unexpected weight change.  HENT: Negative for ear pain and sore throat.   Eyes: Negative for visual disturbance.  Respiratory: Negative for cough, shortness of breath and wheezing.   Cardiovascular: Positive for chest pain and palpitations (occasional). Negative for leg swelling.  Gastrointestinal: Positive for anal bleeding (hemorrhoids bleeding occasionally) and nausea (with menses). Negative for abdominal pain, blood in stool, constipation and diarrhea.       Gerd with tomatoes, spicy foods  Genitourinary: Negative  for dysuria and hematuria.  Musculoskeletal: Negative for arthralgias and back pain.  Skin: Negative for color change and rash.  Neurological: Negative for numbness and headaches.  Psychiatric/Behavioral: Negative for dysphoric mood. The patient is not nervous/anxious.        Objective:   Vitals:   06/24/16 1412  BP: 98/60  Pulse: 66  Resp: 16  Temp: 98.6 F (37 C)   Filed Weights   06/24/16 1412  Weight: 107 lb (48.5 kg)   Body mass index is 20.22 kg/m.   Physical Exam Constitutional: She appears well-developed and well-nourished. No distress.  HENT:  Head: Normocephalic and atraumatic.  Right Ear: External ear normal. Normal ear canal and TM Left Ear: External ear normal.  Normal ear canal and TM Mouth/Throat:  Oropharynx is clear and moist.  Eyes: Conjunctivae and EOM are normal.  Neck: Neck supple. No tracheal deviation present. No thyromegaly present.  No carotid bruit  Cardiovascular: Normal rate, regular rhythm and normal heart sounds.   No murmur heard.  No edema. Pulmonary/Chest: Effort normal and breath sounds normal. No respiratory distress. She has no wheezes. She has no rales.  Breast: deferred to Gyn Abdominal: Soft. She exhibits no distension. There is no tenderness.  Lymphadenopathy: She has no cervical adenopathy.  Skin: Skin is warm and dry. She is not diaphoretic.  Psychiatric: She has a normal mood and affect. Her behavior is normal.         Assessment & Plan:   Physical exam: Screening blood work ordered Start vitamin d 1000 units daily Immunizations tdap today, deferred flu vaccine Gyn  Up to date  Exercise - irregular exercise - she will work on making it more regular Weight - low -normal BMI Skin  - no concerns Substance abuse - none  See Problem List for Assessment and Plan of chronic medical problems.

## 2016-06-29 NOTE — Progress Notes (Signed)
[facility] [address] [place], [place] [address] Phone: [phone] Subjective:    CC: left hip pain Follow-up  [id number]  Brandy Pena is a 25 y.o. female coming in today for follow-up for left hip pain. She took the oral medication prescribed to her at last visit which helped eliminate her pain and she did not end up needing to use the topical medication. She did ride in the car to [place] over Labor Day which cause an increase in her pain. The pain has since gone away and she has not had any pain upon return to [place].       Past Medical History:  Diagnosis Date  . Anxiety   . Asthma    mild intermittent  . History of echocardiogram    Echo 2/17: EF 55-60%, no RWMA, normal diastolic function   Past Surgical History:  Procedure Laterality Date  . NONE     Social History   Social History  . Marital status: Single    Spouse name: N/A  . Number of children: N/A  . Years of education: N/A   Occupational History  . 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 Smoking status: Never Smoker  . Smokeless tobacco: Never Used  . Alcohol use No  . Drug use: No  . Sexual activity: Not on file   Other Topics Concern  . Not on file   Social History Narrative   [employer] in 2015.  Administrator, arts - HS.       Exercise: irregular, walking      Allergies  Allergen Reactions  . Food Other (See Comments)    TOFU-itching and rash   Family History  Problem Relation Age of Onset  . Heart attack Father 96    deceased  . Diabetes Maternal Grandmother     Past medical history, social, surgical and family history all reviewed in electronic medical record.  No pertanent information unless stated regarding to the chief complaint.   Review of Systems: No headache, visual changes, nausea, vomiting, diarrhea, constipation, dizziness, abdominal pain, skin rash, fevers, chills,  night sweats, weight loss, swollen lymph nodes, body aches, joint swelling, muscle aches, chest pain, shortness of breath, mood changes.   Objective  There were no vitals taken for this visit.  General: No apparent distress alert and oriented x3 mood and affect normal, dressed appropriately.  HEENT: Pupils equal, extraocular movements intact  Respiratory: Patient's speak in full sentences and does not appear short of breath  Cardiovascular: No lower extremity edema, non tender, no erythema  Skin: Warm dry intact with no signs of infection or rash on extremities or on axial skeleton.  Abdomen: Soft nontender  Neuro: Cranial nerves II through XII are intact, neurovascularly intact in all extremities with 2+ DTRs and 2+ pulses.  Lymph: No lymphadenopathy of posterior or anterior cervical chain or axillae bilaterally.  Gait normal with good balance and coordination.  MSK:  Non tender with full range of motion and good stability and symmetric strength and tone of shoulders, elbows, wrist, , knee and ankles bilaterally.  Hip: Left ROM IR: 15 Deg, ER: 25 Deg, Flexion: 120 Deg, Extension: 100 Deg, Abduction: 45 Deg, Adduction: 45 Deg Strength IR: 5/5, ER: 5/5, Flexion: 5/5, Extension: 5/5, Abduction: 5/5, Adduction: 5/5 Pelvic alignment unremarkable to inspection and palpation. Standing hip rotation and gait without trendelenburg sign /  unsteadiness. Greater trochanter without tenderness to palpation. No tenderness over piriformis and greater trochanter. No pain with FABER or FADIR. No SI joint tenderness and normal minimal SI movement. Mild tightness to the hamstring but nontender over the ischial bursa area.   Impression and Recommendations:     This case required medical decision making of moderate complexity.      Note: This dictation was prepared with Dragon dictation along with smaller phrase technology. Any transcriptional errors that result from this process are unintentional.

## 2016-07-01 ENCOUNTER — Encounter: Payer: Self-pay | Admitting: Family Medicine

## 2016-07-01 ENCOUNTER — Ambulatory Visit (INDEPENDENT_AMBULATORY_CARE_PROVIDER_SITE_OTHER): Payer: PRIVATE HEALTH INSURANCE | Admitting: Family Medicine

## 2016-07-01 DIAGNOSIS — M6580 Other synovitis and tenosynovitis, unspecified site: Secondary | ICD-10-CM | POA: Diagnosis not present

## 2016-07-01 DIAGNOSIS — M76899 Other specified enthesopathies of unspecified lower limb, excluding foot: Secondary | ICD-10-CM

## 2016-07-01 NOTE — Patient Instructions (Addendum)
God to see you  Brandy Pena is your friend Stay active and do exercises 1-2 times a week.  Vitamin D 2000 IU daily  As long as you do well see me when you need me.

## 2016-07-01 NOTE — Assessment & Plan Note (Signed)
Doing significantly better at this time. No numbness, no radiation of pain. Patient should do very well overall. Follow-up as needed. Encourage her to continue the exercises 1-2 times a week.

## 2017-08-06 DIAGNOSIS — H5203 Hypermetropia, bilateral: Secondary | ICD-10-CM | POA: Diagnosis not present

## 2017-08-06 DIAGNOSIS — H52223 Regular astigmatism, bilateral: Secondary | ICD-10-CM | POA: Diagnosis not present

## 2018-02-06 ENCOUNTER — Other Ambulatory Visit: Payer: Self-pay | Admitting: Internal Medicine

## 2018-02-27 ENCOUNTER — Other Ambulatory Visit: Payer: Self-pay | Admitting: Family Medicine

## 2018-02-27 DIAGNOSIS — M791 Myalgia, unspecified site: Secondary | ICD-10-CM | POA: Diagnosis not present

## 2018-02-27 DIAGNOSIS — N6310 Unspecified lump in the right breast, unspecified quadrant: Secondary | ICD-10-CM

## 2018-03-03 ENCOUNTER — Encounter: Payer: Self-pay | Admitting: Internal Medicine

## 2018-03-12 ENCOUNTER — Other Ambulatory Visit: Payer: Self-pay

## 2018-03-14 ENCOUNTER — Other Ambulatory Visit: Payer: Self-pay | Admitting: Internal Medicine

## 2018-06-23 DIAGNOSIS — Z0100 Encounter for examination of eyes and vision without abnormal findings: Secondary | ICD-10-CM | POA: Diagnosis not present

## 2018-06-23 DIAGNOSIS — H5210 Myopia, unspecified eye: Secondary | ICD-10-CM | POA: Diagnosis not present

## 2018-07-16 DIAGNOSIS — F439 Reaction to severe stress, unspecified: Secondary | ICD-10-CM | POA: Diagnosis not present

## 2018-08-27 NOTE — Progress Notes (Signed)
Subjective:    Patient ID: Brandy GallowayJessica Pena, female    DOB: [birth year], 27 y.o.   MRN: [id number]  HPI The patient is here for an acute visit.  She has mild intermittent asthma.  She last used her albuterol April 2019 - it was very stuffy in the [place] and she used it once and that was it.  Her triggers are allergies and URI's.    She did take Singulair over the summer and that seemed to help a lot.  She does take allergy medication as needed.  Typically her allergies are well controlled.  [facility] is going [facility] [employer] and wants to have a spirometry done.  She currently denies fevers, chills, cough, wheeze and shortness of breath.   Medications and allergies reviewed with patient and updated if appropriate.  Patient Active Problem List   Diagnosis Date Noted  . Ischial bursitis of left side 05/17/2016  . Hamstring tendinitis at origin 05/17/2016  . SVT (supraventricular tachycardia) (HCC) 05/07/2016  . Eczema 05/07/2016  . Left hip pain 05/07/2016  . Mild intermittent asthma     Current Outpatient Medications on File Prior to Visit  Medication Sig Dispense Refill  . Biotin 1 MG CAPS Takes daily    . Diclofenac Sodium (PENNSAID) 2 % SOLN Apply 1 pump twice daily. 112 g 3  . FLUOCINOLONE ACETONIDE SCALP 0.01 % OIL     . hydrocortisone 2.5 % cream     . ketoconazole (NIZORAL) 2 % shampoo     . PROAIR HFA 108 (90 Base) MCG/ACT inhaler INHALE 1-2 PUFFS INTO THE LUNGS EVERY 6 (SIX) HOURS AS NEEDED FOR WHEEZING OR SHORTNESS OF BREATH. 8.5 Inhaler 0  . Zinc Sulfate (ZINC 15) 66 MG TABS Takes daily  0   No current facility-administered medications on file prior to visit.     Past Medical History:  Diagnosis Date  . Anxiety   . Asthma    mild intermittent  . History of echocardiogram    Echo 2/17: EF 55-60%, no RWMA, normal diastolic function    Past Surgical History:  Procedure Laterality Date  . NONE      Social History   Socioeconomic History  . Marital  status: Single    Spouse name: Not on file  . Number of children: Not on file  . Years of education: Not on file  . Highest education level: Not on file  Occupational History  . Occupation: Runner, broadcasting/film/videoTeacher    Comment: [employer] [place], [place]  Social Needs  . Financial resource strain: Not on file  . Food insecurity:    Worry: Not on file    Inability: Not on file  . Transportation needs:    Medical: Not on file    Non-medical: Not on file  Tobacco Use  . Smoking status: Never Smoker  . Smokeless tobacco: Never Used  Substance and Sexual Activity  . Alcohol use: No  . Drug use: No  . Sexual activity: Not on file  Lifestyle  . Physical activity:    Days per week: Not on file    Minutes per session: Not on file  . Stress: Not on file  Relationships  . Social connections:    Talks on phone: Not on file    Gets together: Not on file    Attends religious service: Not on file    Active member of club or organization: Not on file    Attends meetings of clubs or  organizations: Not on file    Relationship status: Not on file  Other Topics Concern  . Not on file  Social History Narrative   Graduate of [employer] in 2015.  Administrator, arts - HS.       Exercise: irregular, walking    Family History  Problem Relation Age of Onset  . Heart attack Father 7       deceased  . Diabetes Maternal Grandmother     Review of Systems  Constitutional: Negative for chills and fever.  Respiratory: Negative for cough, shortness of breath and wheezing.   Cardiovascular: Negative for chest pain, palpitations and leg swelling.  Neurological: Negative for light-headedness and headaches.       Objective:   Vitals:   08/28/18 1441  BP: 112/64  Pulse: 85  Resp: 16  Temp: 98.5 F (36.9 C)  SpO2: 99%   BP Readings from Last 3 Encounters:  08/28/18 112/64  07/01/16 98/62  06/24/16 98/60   Wt Readings from Last 3 Encounters:  08/28/18 115 lb 12.8 oz (52.5 kg)    07/01/16 107 lb 6.4 oz (48.7 kg)  06/24/16 107 lb (48.5 kg)   Body mass index is 21.88 kg/m.   Physical Exam    Constitutional: Appears well-developed and well-nourished. No distress.  HENT:  Head: Normocephalic and atraumatic.  Neck: Neck supple. No tracheal deviation present. No thyromegaly present.  No cervical lymphadenopathy Cardiovascular: Normal rate, regular rhythm and normal heart sounds.   No murmur heard. No carotid bruit .  No edema Pulmonary/Chest: Effort normal and breath sounds normal. No respiratory distress. No has no wheezes. No rales.  Skin: Skin is warm and dry. Not diaphoretic.  Psychiatric: Normal mood and affect. Behavior is normal.       Assessment & Plan:    See Problem List for Assessment and Plan of chronic medical problems.

## 2018-08-28 ENCOUNTER — Ambulatory Visit: Payer: BLUE CROSS/BLUE SHIELD | Admitting: Internal Medicine

## 2018-08-28 ENCOUNTER — Encounter: Payer: Self-pay | Admitting: Internal Medicine

## 2018-08-28 VITALS — BP 112/64 | HR 85 | Temp 98.5°F | Resp 16 | Ht 61.0 in | Wt 115.8 lb

## 2018-08-28 DIAGNOSIS — J309 Allergic rhinitis, unspecified: Secondary | ICD-10-CM | POA: Diagnosis not present

## 2018-08-28 DIAGNOSIS — J4520 Mild intermittent asthma, uncomplicated: Secondary | ICD-10-CM

## 2018-08-28 MED ORDER — MONTELUKAST SODIUM 10 MG PO TABS: 10.0000 mg | ORAL_TABLET | Freq: Every day | ORAL | 3 refills | Status: DC

## 2018-08-28 NOTE — Assessment & Plan Note (Signed)
Seasonal in nature Over-the-counter antihistamine as needed Start singular daily

## 2018-08-28 NOTE — Patient Instructions (Signed)
  Medications reviewed and updated.  Changes include :   singulair 10 mg nightly  Your prescription(s) have been submitted to your pharmacy. Please take as directed and contact our office if you believe you are having problem(s) with the medication(s).    Spirometry testing was done.      Follow up as needed.

## 2018-08-28 NOTE — Assessment & Plan Note (Signed)
Her asthma is mild, intermittent Her triggers are typically seasonal allergies and upper respiratory infections She rarely uses her albuterol She has taken Singulair and allergy medication in the past, which helped control her allergies and her asthma Spirometry here today is normal-very good lung function Singulair daily Albuterol as needed Overall asthma is mild and well controlled

## 2018-12-25 DIAGNOSIS — Z01419 Encounter for gynecological examination (general) (routine) without abnormal findings: Secondary | ICD-10-CM | POA: Diagnosis not present

## 2019-05-03 DIAGNOSIS — L218 Other seborrheic dermatitis: Secondary | ICD-10-CM | POA: Diagnosis not present

## 2019-05-03 DIAGNOSIS — L308 Other specified dermatitis: Secondary | ICD-10-CM | POA: Diagnosis not present

## 2019-09-17 ENCOUNTER — Other Ambulatory Visit: Payer: Self-pay

## 2019-09-17 DIAGNOSIS — Z20822 Contact with and (suspected) exposure to covid-19: Secondary | ICD-10-CM

## 2019-09-17 NOTE — Progress Notes (Signed)
[id number] 

## 2019-09-20 LAB — NOVEL CORONAVIRUS, NAA: SARS-CoV-2, NAA: NOT DETECTED

## 2019-10-19 NOTE — Progress Notes (Signed)
Subjective:    Patient ID: Brandy Pena, female    DOB: 04-12-1991, 29 y.o.   MRN: [id number]  HPI The patient is here for follow up.  Asthma:  She takes the singulair nightly.  She uses the albuterol occasionally.  She has noticed that when she does not eat healthy-eats more takeout or more cheese her asthma is worse and she needs to use her albuterol inhaler more often.  She has revised her diet and is only using it on occasion-less than once a week.  The Singulair has made a big difference with her asthma.  She denies any fevers, cough, wheeze or shortness of breath at this time.  Chronic constipation, hemorrhoids: She sits all day and has been more constipated and has had more flares of her hemorrhoids.  She does have intermittent bleeding and discomfort from the hemorrhoids.  She is currently not exercising.  She does drink a lot of water.  She has not tried taking anything for the constipation.  She has used over-the-counter medications for the hemorrhoids, which have helped.   Medications and allergies reviewed with patient and updated if appropriate.  Patient Active Problem List   Diagnosis Date Noted  . Hemorrhoids 10/20/2019  . Allergic rhinitis 08/28/2018  . Ischial bursitis of left side 05/17/2016  . Hamstring tendinitis at origin 05/17/2016  . SVT (supraventricular tachycardia) (HCC) 05/07/2016  . Eczema 05/07/2016  . Left hip pain 05/07/2016  . Mild intermittent asthma     Current Outpatient Medications on File Prior to Visit  Medication Sig Dispense Refill  . hydrocortisone 2.5 % cream     . ketoconazole (NIZORAL) 2 % shampoo      No current facility-administered medications on file prior to visit.    Past Medical History:  Diagnosis Date  . Anxiety   . Asthma    mild intermittent  . History of echocardiogram    Echo 2/17: EF 55-60%, no RWMA, normal diastolic function    Past Surgical History:  Procedure Laterality Date  . NONE      Social  History   Socioeconomic History  . Marital status: Single    Spouse name: Not on file  . Number of children: Not on file  . Years of education: Not on file  . Highest education level: Not on file  Occupational History  . Occupation: Runner, broadcasting/film/video    Comment: [employer] in [place], [place]  Tobacco Use  . Smoking status: Never Smoker  . Smokeless tobacco: Never Used  Substance and Sexual Activity  . Alcohol use: No  . Drug use: No  . Sexual activity: Not on file  Other Topics Concern  . Not on file  Social History Narrative   Graduate of [employer] in 2015.  Administrator, arts - HS.       Exercise: irregular, walking   Social Determinants of Health   Financial Resource Strain:   . Difficulty of Paying Living Expenses: Not on file  Food Insecurity:   . Worried About Programme researcher, broadcasting/film/video in the Last Year: Not on file  . Ran Out of Food in the Last Year: Not on file  Transportation Needs:   . Lack of Transportation (Medical): Not on file  . Lack of Transportation (Non-Medical): Not on file  Physical Activity:   . Days of Exercise per Week: Not on file  . Minutes of Exercise per Session: Not on file  Stress:   . Feeling of Stress :  Not on file  Social Connections:   . Frequency of Communication with Friends and Family: Not on file  . Frequency of Social Gatherings with Friends and Family: Not on file  . Attends Religious Services: Not on file  . Active Member of Clubs or Organizations: Not on file  . Attends Archivist Meetings: Not on file  . Marital Status: Not on file    Family History  Problem Relation Age of Onset  . Heart attack Father 72       deceased  . Diabetes Maternal Grandmother     Review of Systems  Constitutional: Negative for chills and fever.  Respiratory: Negative for cough, shortness of breath and wheezing.   Cardiovascular: Negative for chest pain, palpitations and leg swelling.  Gastrointestinal: Positive for anal bleeding  (Hemorrhoid related) and constipation. Negative for abdominal pain.  Neurological: Negative for light-headedness and headaches.       Objective:   Vitals:   10/20/19 0929  BP: 110/68  Pulse: 68  Resp: 16  Temp: 99 F (37.2 C)  SpO2: 99%   BP Readings from Last 3 Encounters:  10/20/19 110/68  08/28/18 112/64  07/01/16 98/62   Wt Readings from Last 3 Encounters:  10/20/19 116 lb (52.6 kg)  08/28/18 115 lb 12.8 oz (52.5 kg)  07/01/16 107 lb 6.4 oz (48.7 kg)   Body mass index is 21.92 kg/m.   Physical Exam    Constitutional: Appears well-developed and well-nourished. No distress.  HENT:  Head: Normocephalic and atraumatic.  Neck: Neck supple. No tracheal deviation present. No thyromegaly present.  No cervical lymphadenopathy Cardiovascular: Normal rate, regular rhythm and normal heart sounds.  No murmur heard. No carotid bruit .  No edema Pulmonary/Chest: Effort normal and breath sounds normal. No respiratory distress. No has no wheezes. No rales.  Skin: Skin is warm and dry. Not diaphoretic.  Psychiatric: Normal mood and affect. Behavior is normal.      Assessment & Plan:    See Problem List for Assessment and Plan of chronic medical problems.    This visit occurred during the SARS-CoV-2 public health emergency.  Safety protocols were in place, including screening questions prior to the visit, additional usage of staff PPE, and extensive cleaning of exam room while observing appropriate contact time as indicated for disinfecting solutions.

## 2019-10-20 ENCOUNTER — Ambulatory Visit (INDEPENDENT_AMBULATORY_CARE_PROVIDER_SITE_OTHER): Payer: No Typology Code available for payment source | Admitting: Internal Medicine

## 2019-10-20 ENCOUNTER — Encounter: Payer: Self-pay | Admitting: Internal Medicine

## 2019-10-20 ENCOUNTER — Other Ambulatory Visit: Payer: Self-pay

## 2019-10-20 VITALS — BP 110/68 | HR 68 | Temp 99.0°F | Resp 16 | Ht 61.0 in | Wt 116.0 lb

## 2019-10-20 DIAGNOSIS — K649 Unspecified hemorrhoids: Secondary | ICD-10-CM | POA: Diagnosis not present

## 2019-10-20 DIAGNOSIS — J4520 Mild intermittent asthma, uncomplicated: Secondary | ICD-10-CM

## 2019-10-20 MED ORDER — MONTELUKAST SODIUM 10 MG PO TABS: 10.0000 mg | ORAL_TABLET | Freq: Every day | ORAL | 3 refills | Status: DC

## 2019-10-20 MED ORDER — ALBUTEROL SULFATE HFA 108 (90 BASE) MCG/ACT IN AERS: INHALATION_SPRAY | RESPIRATORY_TRACT | 8 refills | Status: DC

## 2019-10-20 NOTE — Patient Instructions (Addendum)
Try fiber supplementation for your constipation.     Medications reviewed and updated.  Changes include :   none  Your prescription(s) have been submitted to your pharmacy. Please take as directed and contact our office if you believe you are having problem(s) with the medication(s).

## 2019-10-20 NOTE — Assessment & Plan Note (Signed)
Mild, intermittent Chronic, stable-controlled Continue Singulair nightly Albuterol as needed-only taking on average less than once per week Medications renewed

## 2019-10-20 NOTE — Assessment & Plan Note (Addendum)
Chronic, intermittent Intermittent flares with bleeding Related to chronic constipation Continue over-the-counter hemorrhoidal treatment as needed Stressed better control of constipation-start fiber supplementation daily or stool softener.  Can consider probiotic Let me know if there is no improvement

## 2019-11-08 ENCOUNTER — Ambulatory Visit: Payer: No Typology Code available for payment source | Attending: Internal Medicine

## 2019-11-08 DIAGNOSIS — Z20822 Contact with and (suspected) exposure to covid-19: Secondary | ICD-10-CM

## 2019-11-09 LAB — NOVEL CORONAVIRUS, NAA: SARS-CoV-2, NAA: NOT DETECTED

## 2019-12-13 ENCOUNTER — Ambulatory Visit (INDEPENDENT_AMBULATORY_CARE_PROVIDER_SITE_OTHER): Payer: No Typology Code available for payment source | Admitting: Internal Medicine

## 2019-12-13 ENCOUNTER — Encounter: Payer: Self-pay | Admitting: Internal Medicine

## 2019-12-13 DIAGNOSIS — J4541 Moderate persistent asthma with (acute) exacerbation: Secondary | ICD-10-CM

## 2019-12-13 DIAGNOSIS — J45901 Unspecified asthma with (acute) exacerbation: Secondary | ICD-10-CM | POA: Insufficient documentation

## 2019-12-13 MED ORDER — METHYLPREDNISOLONE 4 MG PO TBPK: ORAL_TABLET | ORAL | 0 refills | Status: DC

## 2019-12-13 MED ORDER — AZITHROMYCIN 250 MG PO TABS: ORAL_TABLET | ORAL | 0 refills | Status: DC

## 2019-12-13 NOTE — Progress Notes (Signed)
Virtual Visit via Video Note  I connected with Brandy Pena on 12/13/19 at 10:00 AM EST by a video enabled telemedicine application and verified that I am speaking with the correct person using two identifiers.   I discussed the limitations of evaluation and management by telemedicine and the availability of in person appointments. The patient expressed understanding and agreed to proceed.  Present for the visit:  Myself, Dr Cheryll Cockayne, Lenard Galloway.  The patient is currently at work and I am in the office.    No referring provider.    History of Present Illness: This is an acute visit for wheezing, congestion  She got her first Covid vaccine Friday 19th - that day she was ok.  That night she started to have a fever, headaches and wheezing.  She also has been experiencing some tightness in her chest.  She feels like she is congestion in her chest, but is not able to get anything up.  Her symptoms have improved, but they are not gone.  She has been using her albuterol inhaler and an over-the-counter pill for congestion but it does not seem like it is helping much.  She does not think she has been in contact with anyone who has been sick and was unsure if this was her asthma or an acute illness or a reaction to the vaccine.  She is scheduled for her second vaccine.    Review of Systems  Constitutional: Positive for fever.  HENT: Positive for congestion. Negative for ear pain, sinus pain and sore throat (dry throat).   Respiratory: Positive for shortness of breath and wheezing. Negative for cough.        Chest tightness  Gastrointestinal: Negative for nausea.  Musculoskeletal: Positive for myalgias.  Neurological: Positive for headaches.      Social History   Socioeconomic History  . Marital status: Single    Spouse name: Not on file  . Number of children: Not on file  . Years of education: Not on file  . Highest education level: Not on file  Occupational History  .  Occupation: Runner, broadcasting/film/video    Comment: [employer] in [place], [place]  Tobacco Use  . Smoking status: Never Smoker  . Smokeless tobacco: Never Used  Substance and Sexual Activity  . Alcohol use: No  . Drug use: No  . Sexual activity: Not on file  Other Topics Concern  . Not on file  Social History Narrative   Graduate of [employer] in 2015.  Administrator, arts - HS.       Exercise: irregular, walking   Social Determinants of Health   Financial Resource Strain:   . Difficulty of Paying Living Expenses: Not on file  Food Insecurity:   . Worried About Programme researcher, broadcasting/film/video in the Last Year: Not on file  . Ran Out of Food in the Last Year: Not on file  Transportation Needs:   . Lack of Transportation (Medical): Not on file  . Lack of Transportation (Non-Medical): Not on file  Physical Activity:   . Days of Exercise per Week: Not on file  . Minutes of Exercise per Session: Not on file  Stress:   . Feeling of Stress : Not on file  Social Connections:   . Frequency of Communication with Friends and Family: Not on file  . Frequency of Social Gatherings with Friends and Family: Not on file  . Attends Religious Services: Not on file  . Active Member of Clubs  or Organizations: Not on file  . Attends Archivist Meetings: Not on file  . Marital Status: Not on file     Observations/Objective: Appears well in NAD Breathing normally and speaking in full sentences Skin appears warm and dry  Assessment and Plan:  See Problem List for Assessment and Plan of chronic medical problems.   Follow Up Instructions:    I discussed the assessment and treatment plan with the patient. The patient was provided an opportunity to ask questions and all were answered. The patient agreed with the plan and demonstrated an understanding of the instructions.   The patient was advised to call back or seek an in-person evaluation if the symptoms worsen or if the condition fails to improve  as anticipated.    Binnie Rail, MD

## 2019-12-13 NOTE — Assessment & Plan Note (Signed)
Acute Her symptoms are consistent with an exacerbation of her asthma Cause is uncertain-possibly weather changes, URI or reaction to Covid vaccine Discussed that it may be difficult to know which is the cause, but of course it is concerning if it is a reaction to the vaccine though it was a delayed reaction, so may be more of a side effect Will start a Z-Pak, Medrol Dosepak to help treat the symptoms Continue albuterol and over-the-counter cold/allergy medications as needed Discussed her getting the second Covid vaccine and we both agree she should go ahead with it I did recommend that she stay longer in observation after the vaccine I also discussed that if she has similar or worse side effects after the second vaccine she may need to be evaluated at urgent care or the emergency room

## 2020-01-06 ENCOUNTER — Encounter: Payer: Self-pay | Admitting: Pediatrics

## 2020-01-06 ENCOUNTER — Other Ambulatory Visit: Payer: Self-pay

## 2020-01-06 ENCOUNTER — Ambulatory Visit (INDEPENDENT_AMBULATORY_CARE_PROVIDER_SITE_OTHER): Payer: 59 | Admitting: Pediatrics

## 2020-01-06 VITALS — BP 100/62 | HR 88 | Temp 97.6°F | Resp 16 | Ht 61.2 in | Wt 115.4 lb

## 2020-01-06 DIAGNOSIS — T7800XD Anaphylactic reaction due to unspecified food, subsequent encounter: Secondary | ICD-10-CM

## 2020-01-06 DIAGNOSIS — E739 Lactose intolerance, unspecified: Secondary | ICD-10-CM | POA: Diagnosis not present

## 2020-01-06 DIAGNOSIS — J4530 Mild persistent asthma, uncomplicated: Secondary | ICD-10-CM | POA: Diagnosis not present

## 2020-01-06 DIAGNOSIS — J301 Allergic rhinitis due to pollen: Secondary | ICD-10-CM | POA: Diagnosis not present

## 2020-01-06 MED ORDER — MONTELUKAST SODIUM 10 MG PO TABS: ORAL_TABLET | ORAL | 5 refills | Status: DC

## 2020-01-06 MED ORDER — ALBUTEROL SULFATE HFA 108 (90 BASE) MCG/ACT IN AERS: INHALATION_SPRAY | RESPIRATORY_TRACT | 1 refills | Status: DC

## 2020-01-06 MED ORDER — EPINEPHRINE 0.3 MG/0.3ML IJ SOAJ: INTRAMUSCULAR | 3 refills | Status: AC

## 2020-01-06 MED ORDER — FLUTICASONE PROPIONATE 50 MCG/ACT NA SUSP: NASAL | 5 refills | Status: AC

## 2020-01-06 NOTE — Patient Instructions (Addendum)
Environmental control of dust mite and mold Zyrtec 10 mg-take 1 tablet once a day if needed for runny nose or itchy eyes Fluticasone 2 sprays per nostril once a day if needed for stuffy nose Opcon-A-1 drop 3 times a day if needed for itchy eyes  Montelukast 10 mg-take 1 tablet once a day to prevent coughing or wheezing Proair 2 puffs every 4 hours if needed for wheezing or coughing spells.  You may use ProAir 2 puffs 5 to 15 minutes before exercise  Daily shower.  Pat dry and use hydrocortisone 2.5% cream to red itchy areas of eczema.  Wait 10 minutes and use a lubricating lotion  Avoid tofu and milk products.  If you have an allergic reaction take Benadryl 50 mg every 6 hours and if you have life-threatening symptoms inject with Auvi-Q 0.3 mg  I will let you know the results of your blood work for a soy and milk allergy  Call us if you are not doing well on this treatment plan

## 2020-01-06 NOTE — Progress Notes (Signed)
[address] [place] [address] Dept: [phone]  New Patient Note  Patient ID: Brandy Pena, female    DOB: [birth year]  Age: 29 y.o. MRN: [id number] Date of Office Visit: 01/06/2020 Referring provider: Binnie Rail, MD [place],  [place] [address]    Chief Complaint: Asthma (chest congestion.  not related to exercise.  no sx when patient is working out at [employer].) and Nasal Congestion (alot of mucus in throat)  HPI Brandy Pena presents for an allergy evaluation she has had asthma since childhood.  She also has perennial nasal congestion.  She has aggravation of her symptoms on exposure to dust and cigarette smoke.  She has had eczema since childhood.  Dairy  products give her a stuffy nose and tofu gives her rash.  She does not have gastroesophageal reflux.  She has not had difficulties from sinus infections or pneumonia.  Many years ago she had an asthma exacerbation from Advil but she can take aspirin and Advil at this time without any problems.  Her last asthma exacerbation was on March 1 treated with methylprednisolone for a few days and a Zithromax pack.  She has a history of supraventricular tachycardia which resolved by avoiding caffeine.  She does not take beta-blockers  She has a lactose intolerance.  She gets a stuffy nose when she drinks milk products so she avoids them.  She once had hives from tofu but no other allergic symptoms so she avoids tofu and soy  Review of Systems  Constitutional: Negative.   HENT:       Allergic rhinitis since childhood  Eyes: Negative.   Respiratory:       Asthma since childhood  Cardiovascular:       History of SVTs controlled by eliminating caffeine from her diet  Gastrointestinal: Negative.   Genitourinary: Negative.   Musculoskeletal: Negative.   Skin:       Eczema since childhood  Neurological: Negative.   Endo/Heme/Allergies:       No diabetes or thyroid disease  Psychiatric/Behavioral: Negative.      Outpatient Encounter Medications as of 01/06/2020  Medication Sig  . albuterol (PROAIR HFA) 108 (90 Base) MCG/ACT inhaler 2 puffs every 4 hours if needed for wheezing or coughing spells.  May use 2 puffs 5-15 minutes prior to exercise.  . cetirizine (ZYRTEC) 10 MG tablet Take 10 mg by mouth daily.  . hydrocortisone 2.5 % cream   . ketoconazole (NIZORAL) 2 % shampoo   . montelukast (SINGULAIR) 10 MG tablet One tablet once a day to prevent coughing or wheezing.  . [DISCONTINUED] albuterol (PROAIR HFA) 108 (90 Base) MCG/ACT inhaler INHALE 1-2 PUFFS INTO THE LUNGS EVERY 6 (SIX) HOURS AS NEEDED FOR WHEEZING OR SHORTNESS OF BREATH.  . [DISCONTINUED] montelukast (SINGULAIR) 10 MG tablet Take 1 tablet (10 mg total) by mouth at bedtime.  Marland Kitchen azithromycin (ZITHROMAX) 250 MG tablet Take two tabs the first day and then one tab daily for four days (Patient not taking: Reported on 01/06/2020)  . EPINEPHrine (AUVI-Q) 0.3 mg/0.3 mL IJ SOAJ injection Use as directed for severe allergic reaction.  . fluticasone (FLONASE) 50 MCG/ACT nasal spray Two sprays each nostril once a day as needed for nasal congestion  . methylPREDNISolone (MEDROL DOSEPAK) 4 MG TBPK tablet 24 mg PO on day 1, then decr. by 4 mg/day x5 days (Patient not taking: Reported on 01/06/2020)   No facility-administered encounter medications on file as of 01/06/2020.  Drug Allergies:  Allergies  Allergen Reactions  . Food Other (See Comments)    TOFU-itching and rash    Family History: Brandy Pena's family history includes Allergic rhinitis in her brother, brother, brother, brother, brother, father, sister, sister, and sister; Asthma in her brother, brother, brother, brother, brother, father, sister, sister, and sister; Diabetes in her maternal grandmother; Eczema in her mother; Heart attack (age of onset: 44) in her father..  Family history is negative for sinus problems, angioedema, urticaria, food allergies, chronic bronchitis or  emphysema.  Social and environmental.  She has turtles at home.  She is not exposed to cigarette smoking.  She has not smoked cigarettes in the past.  She teaches science in high school.  Physical Exam: BP 100/62 (BP Location: Left Arm, Patient Position: Sitting, Cuff Size: Small)   Pulse 88   Temp 97.6 F (36.4 C) (Temporal)   Resp 16   Ht 5' 1.2" (1.554 m)   Wt 115 lb 6.4 oz (52.3 kg)   SpO2 100%   BMI 21.66 kg/m    Physical Exam Vitals reviewed.  Constitutional:      Appearance: Normal appearance. She is normal weight.  HENT:     Head:     Comments: Eyes normal.  Ears normal.  Nose mild swelling of the nasal turbinates.  Pharynx normal. Cardiovascular:     Rate and Rhythm: Normal rate and regular rhythm.     Comments: S1-S2 normal no murmurs Pulmonary:     Comments: Clear to percussion and auscultation Abdominal:     Palpations: Abdomen is soft.     Tenderness: There is no abdominal tenderness.     Comments: No hepatosplenomegaly  Musculoskeletal:     Cervical back: Neck supple.  Lymphadenopathy:     Cervical: No cervical adenopathy.  Skin:    Comments: Dry  Neurological:     General: No focal deficit present.     Mental Status: She is alert and oriented to person, place, and time. Mental status is at baseline.  Psychiatric:        Mood and Affect: Mood normal.        Behavior: Behavior normal.        Thought Content: Thought content normal.        Judgment: Judgment normal.     Diagnostics: FVC 2.67 L FEV1 2.53 L.  Predicted FVC 2.89 L predicted FEV1 2.48 L.  After albuterol 2 puffs FVC 2.98 L FEV1 2.78 L-the spirometry is in the normal range and there was a 10% improvement in the FEV1 after albuterol  Allergy skin tests  were very positive to tree pollens and dust mites.  She also had some reactivity to grass pollen , weed pollen , cat and some molds.  Skin testing to soybean, cow's milk and casein was negative   Assessment  Assessment and Plan: 1.  Anaphylactic reaction due to food, subsequent encounter   2. Mild persistent asthma without complication   3. Seasonal allergic rhinitis due to pollen   4. Lactose intolerance     Meds ordered this encounter  Medications  . EPINEPHrine (AUVI-Q) 0.3 mg/0.3 mL IJ SOAJ injection    Sig: Use as directed for severe allergic reaction.    Dispense:  2 each    Refill:  3    Dispense one carton of 2 devices plus trainer.  Marland Kitchen albuterol (PROAIR HFA) 108 (90 Base) MCG/ACT inhaler    Sig: 2 puffs every 4 hours if needed for wheezing or  coughing spells.  May use 2 puffs 5-15 minutes prior to exercise.    Dispense:  18 g    Refill:  1  . montelukast (SINGULAIR) 10 MG tablet    Sig: One tablet once a day to prevent coughing or wheezing.    Dispense:  30 tablet    Refill:  5  . fluticasone (FLONASE) 50 MCG/ACT nasal spray    Sig: Two sprays each nostril once a day as needed for nasal congestion    Dispense:  16 g    Refill:  5    Patient Instructions  Environmental control of dust mite and mold Zyrtec 10 mg-take 1 tablet once a day if needed for runny nose or itchy eyes Fluticasone 2 sprays per nostril once a day if needed for stuffy nose Opcon-A-1 drop 3 times a day if needed for itchy eyes  Montelukast 10 mg-take 1 tablet once a day to prevent coughing or wheezing Proair 2 puffs every 4 hours if needed for wheezing or coughing spells.  You may use ProAir 2 puffs 5 to 15 minutes before exercise  Daily shower.  Pat dry and use hydrocortisone 2.5% cream to red itchy areas of eczema.  Wait 10 minutes and use a lubricating lotion  Avoid tofu and milk products.  If you have an allergic reaction take Benadryl 50 mg every 6 hours and if you have life-threatening symptoms inject with Auvi-Q 0.3 mg  I will let you know the results of your blood work for a soy and milk allergy  Call us if you are not doing well on this treatment plan   Return in about 6 weeks (around 02/17/2020).   Thank you for the  opportunity to care for this patient.  Please do not hesitate to contact me with questions.  Tonette Bihari, M.D.  [facility] [address] [place], [place] [address] [phone]

## 2020-01-30 LAB — MILK COMPONENT PANEL
F076-IgE Alpha Lactalbumin: <0.1 kU/L
F077-IgE Beta Lactoglobulin: <0.1 kU/L
F078-IgE Casein: <0.1 kU/L

## 2020-01-30 LAB — ALLERGEN SOYBEAN: Soybean IgE: <0.1 kU/L

## 2020-01-30 LAB — ALLERGEN MILK: Milk IgE: 0.1 kU/L — AB

## 2020-01-31 ENCOUNTER — Other Ambulatory Visit: Payer: Self-pay

## 2020-01-31 MED ORDER — MONTELUKAST SODIUM 10 MG PO TABS: ORAL_TABLET | ORAL | 1 refills | Status: DC

## 2020-07-04 ENCOUNTER — Encounter (HOSPITAL_COMMUNITY): Payer: Self-pay

## 2020-07-04 ENCOUNTER — Other Ambulatory Visit: Payer: Self-pay

## 2020-07-04 ENCOUNTER — Ambulatory Visit (HOSPITAL_COMMUNITY)
Admission: EM | Admit: 2020-07-04 | Discharge: 2020-07-04 | Disposition: A | Discharge status: Home / Self Care | Payer: BC Managed Care – PPO | Attending: Family Medicine | Admitting: Family Medicine

## 2020-07-04 DIAGNOSIS — R062 Wheezing: Secondary | ICD-10-CM | POA: Insufficient documentation

## 2020-07-04 DIAGNOSIS — J4520 Mild intermittent asthma, uncomplicated: Secondary | ICD-10-CM | POA: Diagnosis not present

## 2020-07-04 DIAGNOSIS — R05 Cough: Secondary | ICD-10-CM

## 2020-07-04 DIAGNOSIS — Z20822 Contact with and (suspected) exposure to covid-19: Secondary | ICD-10-CM | POA: Diagnosis not present

## 2020-07-04 DIAGNOSIS — R059 Cough, unspecified: Secondary | ICD-10-CM

## 2020-07-04 MED ORDER — PREDNISONE 20 MG PO TABS: 40.0000 mg | ORAL_TABLET | Freq: Every day | ORAL | 0 refills | Status: DC

## 2020-07-04 NOTE — ED Triage Notes (Signed)
Pt is here with a cough that started Sunday with a loss of voice, pt has taken Tylenol to relieve discomfort.

## 2020-07-04 NOTE — ED Provider Notes (Signed)
[facility]   [id number] 07/04/20 Arrival Time: 1621  ASSESSMENT & PLAN:  1. Cough   2. Wheezing     Begin: Meds ordered this encounter  Medications  . predniSONE (DELTASONE) 20 MG tablet    Sig: Take 2 tablets (40 mg total) by mouth daily.    Dispense:  10 tablet    Refill:  0    COVID-19 testing sent. See letter/work note on file for self-isolation guidelines. OTC symptom care as needed.   Follow-up Information    Burns, Bobette Mo, MD.   Specialty: Internal Medicine Why: As needed. Contact information: [address] [place] [address] [phone]               Reviewed expectations re: course of current medical issues. Questions answered. Outlined signs and symptoms indicating need for more acute intervention. Understanding verbalized. After Visit Summary given.   SUBJECTIVE: History from: patient. Brandy Pena is a 29 y.o. female who reports cough and hoarse voice; 2-3 days. Known COVID-19 contact: none. Recent travel: none. Denies: fever and difficulty breathing. Normal PO intake without n/v/d. Tylenol with mild relief. Feels she wheezes at times.   OBJECTIVE:  Vitals:   07/04/20 1715 07/04/20 1718  BP:  114/69  Pulse:  70  Resp:  17  Temp:  98.3 F (36.8 C)  TempSrc:  Oral  SpO2:  100%  Weight: 54 kg     General appearance: alert; no distress Eyes: PERRLA; EOMI; conjunctiva normal HENT: [place]; AT; with mild nasal congestion; throat with mild cobblestoning Neck: supple  Lungs: speaks full sentences without difficulty; unlabored; mild bilateral exp wheezes Extremities: no edema Skin: warm and dry Neurologic: normal gait Psychological: alert and cooperative; normal mood and affect  Labs:  Labs Reviewed  SARS CORONAVIRUS 2 (TAT 6-24 HRS)     Allergies  Allergen Reactions  . Food Other (See Comments)    TOFU-itching and rash    Past Medical History:  Diagnosis Date  . Anxiety   . Asthma    mild intermittent    . Eczema   . History of echocardiogram    Echo 2/17: EF 55-60%, no RWMA, normal diastolic function   Social History   Socioeconomic History  . Marital status: Single    Spouse name: Not on file  . Number of children: Not on file  . Years of education: Not on file  . Highest education level: Not on file  Occupational History  . Occupation: Runner, broadcasting/film/video    Comment: [employer] in [place], [place]  Tobacco Use  . Smoking status: Never Smoker  . Smokeless tobacco: Never Used  Vaping Use  . Vaping Use: Never used  Substance and Sexual Activity  . Alcohol use: Yes    Alcohol/week: 1.0 standard drink    Types: 1 Glasses of wine per week    Comment: occasional glass of wine  . Drug use: No  . Sexual activity: Not Currently    Birth control/protection: None  Other Topics Concern  . Not on file  Social History Narrative   Graduate of [employer] in 2015.  Administrator, arts - HS.       Exercise: irregular, walking   Social Determinants of Health   Financial Resource Strain:   . Difficulty of Paying Living Expenses: Not on file  Food Insecurity:   . Worried About Programme researcher, broadcasting/film/video in the Last Year: Not on file  . Ran Out of Food in the Last  Year: Not on file  Transportation Needs:   . Lack of Transportation (Medical): Not on file  . Lack of Transportation (Non-Medical): Not on file  Physical Activity:   . Days of Exercise per Week: Not on file  . Minutes of Exercise per Session: Not on file  Stress:   . Feeling of Stress : Not on file  Social Connections:   . Frequency of Communication with Friends and Family: Not on file  . Frequency of Social Gatherings with Friends and Family: Not on file  . Attends Religious Services: Not on file  . Active Member of Clubs or Organizations: Not on file  . Attends Banker Meetings: Not on file  . Marital Status: Not on file  Intimate Partner Violence:   . Fear of Current or Ex-Partner: Not on file  . Emotionally  Abused: Not on file  . Physically Abused: Not on file  . Sexually Abused: Not on file   Family History  Problem Relation Age of Onset  . Heart attack Father 36       deceased  . Asthma Father   . Allergic rhinitis Father   . Eczema Mother   . Asthma Sister   . Allergic rhinitis Sister   . Asthma Sister   . Allergic rhinitis Sister   . Asthma Sister   . Allergic rhinitis Sister   . Asthma Brother   . Allergic rhinitis Brother   . Diabetes Maternal Grandmother   . Asthma Brother   . Allergic rhinitis Brother   . Asthma Brother   . Allergic rhinitis Brother   . Asthma Brother   . Allergic rhinitis Brother   . Asthma Brother   . Allergic rhinitis Brother    Past Surgical History:  Procedure Laterality Date  . Simonne Martinet, MD 07/04/20 [phone]

## 2020-07-04 NOTE — Discharge Instructions (Signed)
You have been tested for COVID-19 today. °If your test returns positive, you will receive a phone call from [facility] regarding your results. °Negative test results are not called. °Both positive and negative results area always visible on MyChart. °If you do not have a MyChart account, sign up instructions are provided in your discharge papers. °Please do not hesitate to contact us should you have questions or concerns. ° °

## 2020-07-05 LAB — SARS CORONAVIRUS 2 (TAT 6-24 HRS): SARS Coronavirus 2: NEGATIVE

## 2020-07-10 NOTE — Progress Notes (Signed)
Subjective:    Patient ID: Brandy Pena, female    DOB: [birth year], 29 y.o.   MRN: [id number]  HPI The patient is here for an acute visit.   She wants to boost her immune system - has been sick often these past few months.  She has had a few illnesses the past couple of months especially and she does not recall ever being sick this frequently.  Her most recent episode started a couple of weeks ago.  She ended up going to urgent care and was tested for Covid, which was negative.  She had coughing, wheezing, hoarseness.  She was prescribed prednisone, which may have helped.  She still has a lot of mucus and some cough, but the symptoms are mild and improving.  Before this it felt like she was sick every 3 weeks with a fever and a headache and maybe some congestion which would eventually go away.  She was just concerned about her immune system.  She is currently teaching third grade.  She is going to the gym 5 days a week.  She is sleeping good.  Work is stressful.  She is eating well-balanced meal.  Overall she feels her asthma is well controlled.  She also states some constipation over the past month.    Medications and allergies reviewed with patient and updated if appropriate.  Patient Active Problem List   Diagnosis Date Noted   Asthma exacerbation 12/13/2019   Hemorrhoids 10/20/2019   Allergic rhinitis 08/28/2018   Ischial bursitis of left side 05/17/2016   Hamstring tendinitis at origin 05/17/2016   SVT (supraventricular tachycardia) (HCC) 05/07/2016   Eczema 05/07/2016   Left hip pain 05/07/2016   Mild intermittent asthma     Current Outpatient Medications on File Prior to Visit  Medication Sig Dispense Refill   albuterol (PROAIR HFA) 108 (90 Base) MCG/ACT inhaler 2 puffs every 4 hours if needed for wheezing or coughing spells.  May use 2 puffs 5-15 minutes prior to exercise. 18 g 1   cetirizine (ZYRTEC) 10 MG tablet Take 10 mg by mouth daily.      EPINEPHrine (AUVI-Q) 0.3 mg/0.3 mL IJ SOAJ injection Use as directed for severe allergic reaction. 2 each 3   fluticasone (FLONASE) 50 MCG/ACT nasal spray Two sprays each nostril once a day as needed for nasal congestion 16 g 5   hydrocortisone 2.5 % cream      montelukast (SINGULAIR) 10 MG tablet One tablet once a day to prevent coughing or wheezing. 90 tablet 1   predniSONE (DELTASONE) 20 MG tablet Take 2 tablets (40 mg total) by mouth daily. 10 tablet 0   No current facility-administered medications on file prior to visit.    Past Medical History:  Diagnosis Date   Anxiety    Asthma    mild intermittent   Eczema    History of echocardiogram    Echo 2/17: EF 55-60%, no RWMA, normal diastolic function    Past Surgical History:  Procedure Laterality Date   NONE      Social History   Socioeconomic History   Marital status: Single    Spouse name: Not on file   Number of children: Not on file   Years of education: Not on file   Highest education level: Not on file  Occupational History   Occupation: Runner, broadcasting/film/video    Comment: [employer] in [place], [place]  Tobacco Use   Smoking status: Never Smoker   Smokeless tobacco:  Never Used  Vaping Use   Vaping Use: Never used  Substance and Sexual Activity   Alcohol use: Yes    Alcohol/week: 1.0 standard drink    Types: 1 Glasses of wine per week    Comment: occasional glass of wine   Drug use: No   Sexual activity: Not Currently    Birth control/protection: None  Other Topics Concern   Not on file  Social History Narrative   Graduate of [employer] in 2015.  Administrator, arts - HS.       Exercise: irregular, walking   Social Determinants of Health   Financial Resource Strain:    Difficulty of Paying Living Expenses: Not on file  Food Insecurity:    Worried About Running Out of Food in the Last Year: Not on file   Ran Out of Food in the Last Year: Not on file  Transportation Needs:     Lack of Transportation (Medical): Not on file   Lack of Transportation (Non-Medical): Not on file  Physical Activity:    Days of Exercise per Week: Not on file   Minutes of Exercise per Session: Not on file  Stress:    Feeling of Stress : Not on file  Social Connections:    Frequency of Communication with Friends and Family: Not on file   Frequency of Social Gatherings with Friends and Family: Not on file   Attends Religious Services: Not on file   Active Member of Clubs or Organizations: Not on file   Attends Banker Meetings: Not on file   Marital Status: Not on file    Family History  Problem Relation Age of Onset   Heart attack Father 14       deceased   Asthma Father    Allergic rhinitis Father    Eczema Mother    Asthma Sister    Allergic rhinitis Sister    Asthma Sister    Allergic rhinitis Sister    Asthma Sister    Allergic rhinitis Sister    Asthma Brother    Allergic rhinitis Brother    Diabetes Maternal Grandmother    Asthma Brother    Allergic rhinitis Brother    Asthma Brother    Allergic rhinitis Brother    Asthma Brother    Allergic rhinitis Brother    Asthma Brother    Allergic rhinitis Brother     Review of Systems  Constitutional: Negative for fever.  HENT: Positive for postnasal drip.   Respiratory: Positive for cough (PND related). Negative for shortness of breath and wheezing.        Objective:   Vitals:   07/11/20 1534  BP: 106/64  Pulse: 77  Temp: 98.5 F (36.9 C)  SpO2: 97%   BP Readings from Last 3 Encounters:  07/11/20 106/64  07/04/20 114/69  01/06/20 100/62   Wt Readings from Last 3 Encounters:  07/04/20 119 lb (54 kg)  01/06/20 115 lb 6.4 oz (52.3 kg)  10/20/19 116 lb (52.6 kg)   There is no height or weight on file to calculate BMI.   Physical Exam    Constitutional: Appears well-developed and well-nourished. No distress.  Head: Normocephalic and atraumatic.  Neck:  Neck supple. No tracheal deviation present. No thyromegaly present.  No cervical lymphadenopathy Cardiovascular: Normal rate, regular rhythm and normal heart sounds.  No murmur heard. No edema Pulmonary/Chest: Effort normal and breath sounds normal. No respiratory distress. No has no wheezes. No rales.  Skin: Skin is  warm and dry. Not diaphoretic.  Psychiatric: Normal mood and affect. Behavior is normal.       Assessment & Plan:    See Problem List for Assessment and Plan of chronic medical problems.    This visit occurred during the SARS-CoV-2 public health emergency.  Safety protocols were in place, including screening questions prior to the visit, additional usage of staff PPE, and extensive cleaning of exam room while observing appropriate contact time as indicated for disinfecting solutions.

## 2020-07-11 ENCOUNTER — Encounter: Payer: Self-pay | Admitting: Internal Medicine

## 2020-07-11 ENCOUNTER — Other Ambulatory Visit: Payer: Self-pay

## 2020-07-11 ENCOUNTER — Ambulatory Visit (INDEPENDENT_AMBULATORY_CARE_PROVIDER_SITE_OTHER): Payer: BC Managed Care – PPO | Admitting: Internal Medicine

## 2020-07-11 DIAGNOSIS — J00 Acute nasopharyngitis [common cold]: Secondary | ICD-10-CM | POA: Insufficient documentation

## 2020-07-11 DIAGNOSIS — K5900 Constipation, unspecified: Secondary | ICD-10-CM

## 2020-07-11 DIAGNOSIS — J311 Chronic nasopharyngitis: Secondary | ICD-10-CM

## 2020-07-11 NOTE — Assessment & Plan Note (Signed)
Acute This is something that is relatively new in the past couple of months I doubt that she has any sort of immune problem Discussed things to help keep her immune system strong such as good sleep, regular exercise, healthy diet and decreasing stress levels Discussed that her exposure through work is probably influencing some of these illnesses Discussed some several meds she could take, but discussed that many of them are not proven to truly help Can consider probiotics which would help her constipation and her immune system

## 2020-07-11 NOTE — Patient Instructions (Signed)
Try the probiotics for your constipation and immune system.

## 2020-07-11 NOTE — Assessment & Plan Note (Signed)
New problem She has been experiencing more constipation recently and it is uncomfortable Discussed several natural things she could try first and she is thinking about trying probiotics.  Discussed this may also help some with her immune system Discussed I think she can add to the probiotics if needed Call if no improvement

## 2020-12-18 DIAGNOSIS — Z130 Encounter for screening for diseases of the blood and blood-forming organs and certain disorders involving the immune mechanism: Secondary | ICD-10-CM | POA: Diagnosis not present

## 2020-12-18 DIAGNOSIS — Z01419 Encounter for gynecological examination (general) (routine) without abnormal findings: Secondary | ICD-10-CM | POA: Diagnosis not present

## 2020-12-18 DIAGNOSIS — Z124 Encounter for screening for malignant neoplasm of cervix: Secondary | ICD-10-CM | POA: Diagnosis not present

## 2020-12-18 DIAGNOSIS — Z6822 Body mass index (BMI) 22.0-22.9, adult: Secondary | ICD-10-CM | POA: Diagnosis not present

## 2020-12-19 DIAGNOSIS — Z124 Encounter for screening for malignant neoplasm of cervix: Secondary | ICD-10-CM | POA: Diagnosis not present

## 2021-01-17 NOTE — Progress Notes (Signed)
Subjective:    Patient ID: Brandy Pena, female    DOB: 20-May-1991, 30 y.o.   MRN: [id number]  HPI The patient is here for an acute visit.   Frequent colds - she is concerned about being sick every month.  She always has a sore throat and wonders if it is related to her tonsils.   She has a sore throat about every 3 weeks.  She has noticed tonsil stones and she removed them.  The right side of her throat is where it hurts. She will get ear pain and headaches.  She gets sinus headaches which are helped with sudafed.  She takes Catering manager when she has sore throat and chews on ginger.  When she has a sore throat she has a subjective fever and will take tylenol and ibuprofen.    The sore throat will last for one week and the second week she will get congestion and then after that she has hoarseness and drainage.  This all lasts about two and a half weeks and then it restarts a few days after going away.    This pattern started about one year ago maybe a little more.  She has lived at the same place for years.  She did get an air purifier and a humidifier.     She takes zyrtec as needed for seasonal allergies.  She takes singulair nightly.  The flonase helps with the drainage.   Medications and allergies reviewed with patient and updated if appropriate.  Patient Active Problem List   Diagnosis Date Noted  . Constipation 07/11/2020  . Frequent common colds 07/11/2020  . Asthma exacerbation 12/13/2019  . Hemorrhoids 10/20/2019  . Allergic rhinitis 08/28/2018  . Ischial bursitis of left side 05/17/2016  . Hamstring tendinitis at origin 05/17/2016  . SVT (supraventricular tachycardia) (HCC) 05/07/2016  . Eczema 05/07/2016  . Left hip pain 05/07/2016  . Mild intermittent asthma     Current Outpatient Medications on File Prior to Visit  Medication Sig Dispense Refill  . albuterol (PROAIR HFA) 108 (90 Base) MCG/ACT inhaler 2 puffs every 4 hours if needed for wheezing or coughing  spells.  May use 2 puffs 5-15 minutes prior to exercise. 18 g 1  . cetirizine (ZYRTEC) 10 MG tablet Take 10 mg by mouth daily.    Marland Kitchen EPINEPHrine (AUVI-Q) 0.3 mg/0.3 mL IJ SOAJ injection Use as directed for severe allergic reaction. 2 each 3  . fluticasone (FLONASE) 50 MCG/ACT nasal spray Two sprays each nostril once a day as needed for nasal congestion 16 g 5  . hydrocortisone 2.5 % cream     . montelukast (SINGULAIR) 10 MG tablet One tablet once a day to prevent coughing or wheezing. 90 tablet 1   No current facility-administered medications on file prior to visit.    Past Medical History:  Diagnosis Date  . Anxiety   . Asthma    mild intermittent  . Eczema   . History of echocardiogram    Echo 2/17: EF 55-60%, no RWMA, normal diastolic function    Past Surgical History:  Procedure Laterality Date  . NONE      Social History   Socioeconomic History  . Marital status: Single    Spouse name: Not on file  . Number of children: Not on file  . Years of education: Not on file  . Highest education level: Not on file  Occupational History  . Occupation: Runner, broadcasting/film/video    Comment: [employer] in [place], [place]  Tobacco Use  . Smoking status: Never Smoker  . Smokeless tobacco: Never Used  Vaping Use  . Vaping Use: Never used  Substance and Sexual Activity  . Alcohol use: Yes    Alcohol/week: 1.0 standard drink    Types: 1 Glasses of wine per week    Comment: occasional glass of wine  . Drug use: No  . Sexual activity: Not Currently    Birth control/protection: None  Other Topics Concern  . Not on file  Social History Narrative   Graduate of [employer] in 2015.  Administrator, arts - HS.       Exercise: irregular, walking   Social Determinants of Health   Financial Resource Strain: Not on file  Food Insecurity: Not on file  Transportation Needs: Not on file  Physical Activity: Not on file  Stress: Not on file  Social Connections: Not on file    Family  History  Problem Relation Age of Onset  . Heart attack Father 24       deceased  . Asthma Father   . Allergic rhinitis Father   . Eczema Mother   . Asthma Sister   . Allergic rhinitis Sister   . Asthma Sister   . Allergic rhinitis Sister   . Asthma Sister   . Allergic rhinitis Sister   . Asthma Brother   . Allergic rhinitis Brother   . Diabetes Maternal Grandmother   . Asthma Brother   . Allergic rhinitis Brother   . Asthma Brother   . Allergic rhinitis Brother   . Asthma Brother   . Allergic rhinitis Brother   . Asthma Brother   . Allergic rhinitis Brother     Review of Systems  Constitutional: Positive for fever (subjective).  HENT: Positive for congestion, ear pain (right side), postnasal drip, sinus pain and sore throat.   Respiratory: Positive for shortness of breath (when congested). Negative for cough and wheezing.   Gastrointestinal:       Occ gerd - with eating tomatoes - she avoids them  Neurological: Positive for headaches.       Objective:   Vitals:   01/18/21 0752  BP: 110/74  Pulse: 68  Temp: 98.7 F (37.1 C)  SpO2: 99%   BP Readings from Last 3 Encounters:  01/18/21 110/74  07/11/20 106/64  07/04/20 114/69   Wt Readings from Last 3 Encounters:  01/18/21 114 lb (51.7 kg)  07/04/20 119 lb (54 kg)  01/06/20 115 lb 6.4 oz (52.3 kg)   Body mass index is 21.4 kg/m.   Physical Exam    GENERAL APPEARANCE: Appears stated age, well appearing, NAD EYES: conjunctiva clear, no icterus HENT: bilateral tympanic membranes and ear canals normal, oropharynx with no erythema or exudates, trachea midline, no cervical or supraclavicular lymphadenopathy LUNGS: Unlabored breathing, good air entry bilaterally, clear to auscultation without wheeze or crackles CARDIOVASCULAR: Normal S1,S2 , no edema SKIN: Warm, dry      Assessment & Plan:    Frequent common colds, frequent sore throat, tonsil stones: Over the past year or so she has developed recurrent  sore throats/cold symptoms She history has recurrent tonsil stones She has been treating her symptoms with over-the-counter medications and allergy medications Reassured her about the tonsil stones-she does gargle-continue gargling Advised to take Zyrtec daily, use Flonase and Singulair on a daily basis Exam normal here today I do not think these are bacterial infections since she is able to treat them with over-the-counter  medications Question anatomical issue-we will refer to ENT May need to see allergy    This visit occurred during the SARS-CoV-2 public health emergency.  Safety protocols were in place, including screening questions prior to the visit, additional usage of staff PPE, and extensive cleaning of exam room while observing appropriate contact time as indicated for disinfecting solutions.

## 2021-01-18 ENCOUNTER — Ambulatory Visit (INDEPENDENT_AMBULATORY_CARE_PROVIDER_SITE_OTHER): Payer: BC Managed Care – PPO | Admitting: Internal Medicine

## 2021-01-18 ENCOUNTER — Encounter: Payer: Self-pay | Admitting: Internal Medicine

## 2021-01-18 ENCOUNTER — Other Ambulatory Visit: Payer: Self-pay

## 2021-01-18 VITALS — BP 110/74 | HR 68 | Temp 98.7°F | Ht 61.2 in | Wt 114.0 lb

## 2021-01-18 DIAGNOSIS — J358 Other chronic diseases of tonsils and adenoids: Secondary | ICD-10-CM

## 2021-01-18 DIAGNOSIS — J029 Acute pharyngitis, unspecified: Secondary | ICD-10-CM

## 2021-01-18 DIAGNOSIS — J00 Acute nasopharyngitis [common cold]: Secondary | ICD-10-CM

## 2021-01-18 NOTE — Patient Instructions (Signed)
   A referral was ordered for ENT.       Someone from their office will call you to schedule an appointment.

## 2021-01-29 ENCOUNTER — Ambulatory Visit: Payer: BC Managed Care – PPO | Admitting: Internal Medicine

## 2021-02-02 ENCOUNTER — Other Ambulatory Visit: Payer: Self-pay | Admitting: Pediatrics

## 2021-02-14 ENCOUNTER — Other Ambulatory Visit: Payer: Self-pay

## 2021-02-14 ENCOUNTER — Encounter (INDEPENDENT_AMBULATORY_CARE_PROVIDER_SITE_OTHER): Payer: Self-pay | Admitting: Otolaryngology

## 2021-02-14 ENCOUNTER — Ambulatory Visit (INDEPENDENT_AMBULATORY_CARE_PROVIDER_SITE_OTHER): Payer: BC Managed Care – PPO | Admitting: Otolaryngology

## 2021-02-14 VITALS — Temp 97.2°F

## 2021-02-14 DIAGNOSIS — Z8709 Personal history of other diseases of the respiratory system: Secondary | ICD-10-CM

## 2021-02-14 DIAGNOSIS — J310 Chronic rhinitis: Secondary | ICD-10-CM | POA: Diagnosis not present

## 2021-02-14 NOTE — Progress Notes (Signed)
HPI: Brandy Pena is a 30 y.o. female who presents is referred by her PCP Dr. Lawerance Bach for evaluation of tonsil complaints.  She states that she frequently has tonsil stones that sometimes cause her sore throat.  Last September she apparently had a bad sore throat that lasted for several weeks.  She is doing well today with no complaints of sore throat.  She does complain of mild nasal congestion and feels like when her nose is congested she will have more throat problems..  Past Medical History:  Diagnosis Date  . Anxiety   . Asthma    mild intermittent  . Eczema   . History of echocardiogram    Echo 2/17: EF 55-60%, no RWMA, normal diastolic function   Past Surgical History:  Procedure Laterality Date  . NONE     Social History   Socioeconomic History  . Marital status: Single    Spouse name: Not on file  . Number of children: Not on file  . Years of education: Not on file  . Highest education level: Not on file  Occupational History  . Occupation: Runner, broadcasting/film/video    Comment: [employer] in [place], [place]  Tobacco Use  . Smoking status: Never Smoker  . Smokeless tobacco: Never Used  Vaping Use  . Vaping Use: Never used  Substance and Sexual Activity  . Alcohol use: Yes    Alcohol/week: 1.0 standard drink    Types: 1 Glasses of wine per week    Comment: occasional glass of wine  . Drug use: No  . Sexual activity: Not Currently    Birth control/protection: None  Other Topics Concern  . Not on file  Social History Narrative   Graduate of [employer] in 2015.  Administrator, arts - HS.       Exercise: irregular, walking   Social Determinants of Health   Financial Resource Strain: Not on file  Food Insecurity: Not on file  Transportation Needs: Not on file  Physical Activity: Not on file  Stress: Not on file  Social Connections: Not on file   Family History  Problem Relation Age of Onset  . Heart attack Father 71       deceased  . Asthma Father   . Allergic  rhinitis Father   . Eczema Mother   . Asthma Sister   . Allergic rhinitis Sister   . Asthma Sister   . Allergic rhinitis Sister   . Asthma Sister   . Allergic rhinitis Sister   . Asthma Brother   . Allergic rhinitis Brother   . Diabetes Maternal Grandmother   . Asthma Brother   . Allergic rhinitis Brother   . Asthma Brother   . Allergic rhinitis Brother   . Asthma Brother   . Allergic rhinitis Brother   . Asthma Brother   . Allergic rhinitis Brother    Allergies  Allergen Reactions  . Food Other (See Comments)    TOFU-itching and rash   Prior to Admission medications   Medication Sig Start Date End Date Taking? Authorizing Provider  albuterol (PROAIR HFA) 108 (90 Base) MCG/ACT inhaler 2 puffs every 4 hours if needed for wheezing or coughing spells.  May use 2 puffs 5-15 minutes prior to exercise. 01/06/20   Fletcher Anon, MD  cetirizine (ZYRTEC) 10 MG tablet Take 10 mg by mouth daily.    [provider]  EPINEPHrine (AUVI-Q) 0.3 mg/0.3 mL IJ SOAJ injection Use as directed for severe allergic reaction. 01/06/20  Fletcher Anon, MD  fluticasone Aleda Grana) 50 MCG/ACT nasal spray Two sprays each nostril once a day as needed for nasal congestion 01/06/20   Fletcher Anon, MD  hydrocortisone 2.5 % cream  02/14/16   [provider]  montelukast (SINGULAIR) 10 MG tablet One tablet once a day to prevent coughing or wheezing. 01/31/20   Fletcher Anon, MD     Positive ROS: Otherwise negative  All other systems have been reviewed and were otherwise negative with the exception of those mentioned in the HPI and as above.  Physical Exam: Constitutional: Alert, well-appearing, no acute distress Ears: External ears without lesions or tenderness. Ear canals are clear bilaterally with intact, clear TMs.  Nasal: External nose without lesions. Septum with mild deformity.  Moderate rhinitis with turbinate hypertrophy worse on the right side.  After decongesting the nose  nasal passages otherwise clear.  Both middle meatus regions are clear.. Oral: Lips and gums without lesions. Tongue and palate mucosa without lesions. Posterior oropharynx clear.  Tonsils are symmetric and small bilaterally no tonsillar problems noted.  Indirect laryngoscopy revealed a clear base of tongue vallecula and epiglottis. Neck: No palpable adenopathy or masses Respiratory: Breathing comfortably  Skin: No facial/neck lesions or rash noted.  Procedures  Assessment: History of tonsil problems with normal tonsil exam in the office today. Chronic rhinitis with nasal congestion.  Plan: Concerning the nasal congestion would recommend regular use of Flonase 2 sprays each nostril at night.  She has used this in the past but not on a regular basis. Concerning the tonsil stones and tonsil problems suggested use of gargling with antiseptic such as scope or Listerine or just plain saline.  Reassured her of normal tonsil exam otherwise. If she continues to have chronic problems could consider surgical intervention tonsillectomy and turbinate reductions but she is not really interested in any surgical intervention. She will follow-up as needed   Narda Bonds, MD   CC:

## 2021-09-11 ENCOUNTER — Encounter: Payer: Self-pay | Admitting: Internal Medicine

## 2021-09-11 ENCOUNTER — Encounter: Payer: BC Managed Care – PPO | Admitting: Internal Medicine

## 2021-09-11 NOTE — Progress Notes (Signed)
Subjective:    Patient ID: Brandy Pena, female    DOB: 14-Mar-1991, 30 y.o.   MRN: [id number]   This visit occurred during the SARS-CoV-2 public health emergency.  Safety protocols were in place, including screening questions prior to the visit, additional usage of staff PPE, and extensive cleaning of exam room while observing appropriate contact time as indicated for disinfecting solutions.    HPI She is here for a physical exam.     Medications and allergies reviewed with patient and updated if appropriate.  Patient Active Problem List   Diagnosis Date Noted  . Anemia 09/21/2021  . Constipation 07/11/2020  . Hemorrhoids 10/20/2019  . Allergic rhinitis 08/28/2018  . Ischial bursitis of left side 05/17/2016  . Hamstring tendinitis at origin 05/17/2016  . SVT (supraventricular tachycardia) (HCC) 05/07/2016  . Eczema 05/07/2016  . Left hip pain 05/07/2016  . Mild intermittent asthma     Current Outpatient Medications on File Prior to Visit  Medication Sig Dispense Refill  . albuterol (PROAIR HFA) 108 (90 Base) MCG/ACT inhaler 2 puffs every 4 hours if needed for wheezing or coughing spells.  May use 2 puffs 5-15 minutes prior to exercise. 18 g 1  . cetirizine (ZYRTEC) 10 MG tablet Take 10 mg by mouth daily.    Marland Kitchen EPINEPHrine (AUVI-Q) 0.3 mg/0.3 mL IJ SOAJ injection Use as directed for severe allergic reaction. 2 each 3  . fluticasone (FLONASE) 50 MCG/ACT nasal spray Two sprays each nostril once a day as needed for nasal congestion 16 g 5  . hydrocortisone 2.5 % cream      No current facility-administered medications on file prior to visit.    Past Medical History:  Diagnosis Date  . Anxiety   . Asthma    mild intermittent  . Eczema   . History of echocardiogram    Echo 2/17: EF 55-60%, no RWMA, normal diastolic function    Past Surgical History:  Procedure Laterality Date  . NONE      Social History   Socioeconomic History  . Marital status: Single     Spouse name: Not on file  . Number of children: Not on file  . Years of education: Not on file  . Highest education level: Not on file  Occupational History  . Occupation: Runner, broadcasting/film/video    Comment: [employer] in [place], [place]  Tobacco Use  . Smoking status: Never  . Smokeless tobacco: Never  Vaping Use  . Vaping Use: Never used  Substance and Sexual Activity  . Alcohol use: Yes    Alcohol/week: 1.0 standard drink    Types: 1 Glasses of wine per week    Comment: occasional glass of wine  . Drug use: No  . Sexual activity: Not Currently    Birth control/protection: None  Other Topics Concern  . Not on file  Social History Narrative   Graduate of [employer] in 2015.  Administrator, arts - HS.       Exercise: irregular, walking   Social Determinants of Health   Financial Resource Strain: Not on file  Food Insecurity: Not on file  Transportation Needs: Not on file  Physical Activity: Not on file  Stress: Not on file  Social Connections: Not on file    Family History  Problem Relation Age of Onset  . Heart attack Father 61       deceased  . Asthma Father   . Allergic rhinitis Father   . Eczema Mother   .  Asthma Sister   . Allergic rhinitis Sister   . Asthma Sister   . Allergic rhinitis Sister   . Asthma Sister   . Allergic rhinitis Sister   . Asthma Brother   . Allergic rhinitis Brother   . Diabetes Maternal Grandmother   . Asthma Brother   . Allergic rhinitis Brother   . Asthma Brother   . Allergic rhinitis Brother   . Asthma Brother   . Allergic rhinitis Brother   . Asthma Brother   . Allergic rhinitis Brother     Review of Systems     Objective:  There were no vitals filed for this visit. There were no vitals filed for this visit. There is no height or weight on file to calculate BMI.  BP Readings from Last 3 Encounters:  09/21/21 100/62  01/18/21 110/74  07/11/20 106/64    Wt Readings from Last 3 Encounters:  09/21/21 123 lb (55.8 kg)   01/18/21 114 lb (51.7 kg)  07/04/20 119 lb (54 kg)    Depression screen [facility] 2/9 01/18/2021 10/20/2019 08/28/2018  Decreased Interest 1 0 0  Down, Depressed, Hopeless 1 0 0  PHQ - 2 Score 2 0 0  Altered sleeping 2 - -  Tired, decreased energy 1 - -  Change in appetite 1 - -  Feeling bad or failure about yourself  1 - -  Trouble concentrating 1 - -  Moving slowly or fidgety/restless 0 - -  Suicidal thoughts 0 - -  PHQ-9 Score 8 - -  Difficult doing work/chores Somewhat difficult - -     GAD 7 : Generalized Anxiety Score 01/18/2021  Nervous, Anxious, on Edge 2  Control/stop worrying 2  Worry too much - different things 2  Trouble relaxing 2  Restless 1  Easily annoyed or irritable 2  Afraid - awful might happen 3  Total GAD 7 Score 14  Anxiety Difficulty Somewhat difficult       Physical Exam Constitutional: She appears well-developed and well-nourished. No distress.  HENT:  Head: Normocephalic and atraumatic.  Right Ear: External ear normal. Normal ear canal and TM Left Ear: External ear normal.  Normal ear canal and TM Mouth/Throat: Oropharynx is clear and moist.  Eyes: Conjunctivae and EOM are normal.  Neck: Neck supple. No tracheal deviation present. No thyromegaly present.  No carotid bruit  Cardiovascular: Normal rate, regular rhythm and normal heart sounds.   No murmur heard.  No edema. Pulmonary/Chest: Effort normal and breath sounds normal. No respiratory distress. She has no wheezes. She has no rales.  Breast: deferred   Abdominal: Soft. She exhibits no distension. There is no tenderness.  Lymphadenopathy: She has no cervical adenopathy.  Skin: Skin is warm and dry. She is not diaphoretic.  Psychiatric: She has a normal mood and affect. Her behavior is normal.     Lab Results  Component Value Date   WBC 4.9 09/21/2021   HGB 9.7 (L) 09/21/2021   HCT 30.7 (L) 09/21/2021   PLT 379.0 09/21/2021   GLUCOSE 74 09/21/2021   CHOL 135 09/21/2021   TRIG 75.0  09/21/2021   HDL 64.40 09/21/2021   LDLCALC 56 09/21/2021   ALT 13 09/21/2021   AST 26 09/21/2021   NA 140 09/21/2021   K 3.5 09/21/2021   CL 103 09/21/2021   CREATININE 0.90 09/21/2021   BUN 10 09/21/2021   CO2 29 09/21/2021   TSH 0.78 09/21/2021         Assessment & Plan:  Physical exam: Screening blood work  ordered Exercise   Weight   Substance abuse  none   Reviewed recommended immunizations.   Health Maintenance  Topic Date Due  . COVID-19 Vaccine (1) Never done  . PAP SMEAR-Modifier  Never done  . INFLUENZA VACCINE  01/11/2022 (Originally 05/14/2021)  . TETANUS/TDAP  06/24/2026  . Hepatitis C Screening  Completed  . HIV Screening  Completed  . HPV VACCINES  Aged Out  . Pneumococcal Vaccine 86-39 Years old  Discontinued          See Problem List for Assessment and Plan of chronic medical problems.      This encounter was created in error - please disregard.

## 2021-09-11 NOTE — Patient Instructions (Signed)
Blood work was ordered.     Medications changes include :     Your prescription(s) have been submitted to your pharmacy. Please take as directed and contact our office if you believe you are having problem(s) with the medication(s).   A referral was ordered for        Someone from their office will call you to schedule an appointment.    Please followup in 1 year    Health Maintenance, Female Adopting a healthy lifestyle and getting preventive care are important in promoting health and wellness. Ask your health care provider about: The right schedule for you to have regular tests and exams. Things you can do on your own to prevent diseases and keep yourself healthy. What should I know about diet, weight, and exercise? Eat a healthy diet  Eat a diet that includes plenty of vegetables, fruits, low-fat dairy products, and lean protein. Do not eat a lot of foods that are high in solid fats, added sugars, or sodium. Maintain a healthy weight Body mass index (BMI) is used to identify weight problems. It estimates body fat based on height and weight. Your health care provider can help determine your BMI and help you achieve or maintain a healthy weight. Get regular exercise Get regular exercise. This is one of the most important things you can do for your health. Most adults should: Exercise for at least 150 minutes each week. The exercise should increase your heart rate and make you sweat (moderate-intensity exercise). Do strengthening exercises at least twice a week. This is in addition to the moderate-intensity exercise. Spend less time sitting. Even light physical activity can be beneficial. Watch cholesterol and blood lipids Have your blood tested for lipids and cholesterol at 32 years of age, then have this test every 5 years. Have your cholesterol levels checked more often if: Your lipid or cholesterol levels are high. You are older than 32 years of age. You are at high risk for  heart disease. What should I know about cancer screening? Depending on your health history and family history, you may need to have cancer screening at various ages. This may include screening for: Breast cancer. Cervical cancer. Colorectal cancer. Skin cancer. Lung cancer. What should I know about heart disease, diabetes, and high blood pressure? Blood pressure and heart disease High blood pressure causes heart disease and increases the risk of stroke. This is more likely to develop in people who have high blood pressure readings or are overweight. Have your blood pressure checked: Every 3-5 years if you are 18-39 years of age. Every year if you are 40 years old or older. Diabetes Have regular diabetes screenings. This checks your fasting blood sugar level. Have the screening done: Once every three years after age 40 if you are at a normal weight and have a low risk for diabetes. More often and at a younger age if you are overweight or have a high risk for diabetes. What should I know about preventing infection? Hepatitis B If you have a higher risk for hepatitis B, you should be screened for this virus. Talk with your health care provider to find out if you are at risk for hepatitis B infection. Hepatitis C Testing is recommended for: Everyone born from 1945 through 1965. Anyone with known risk factors for hepatitis C. Sexually transmitted infections (STIs) Get screened for STIs, including gonorrhea and chlamydia, if: You are sexually active and are younger than 32 years of age. You are older than   32 years of age and your health care provider tells you that you are at risk for this type of infection. Your sexual activity has changed since you were last screened, and you are at increased risk for chlamydia or gonorrhea. Ask your health care provider if you are at risk. Ask your health care provider about whether you are at high risk for HIV. Your health care provider may recommend a  prescription medicine to help prevent HIV infection. If you choose to take medicine to prevent HIV, you should first get tested for HIV. You should then be tested every 3 months for as long as you are taking the medicine. Pregnancy If you are about to stop having your period (premenopausal) and you may become pregnant, seek counseling before you get pregnant. Take 400 to 800 micrograms (mcg) of folic acid every day if you become pregnant. Ask for birth control (contraception) if you want to prevent pregnancy. Osteoporosis and menopause Osteoporosis is a disease in which the bones lose minerals and strength with aging. This can result in bone fractures. If you are 65 years old or older, or if you are at risk for osteoporosis and fractures, ask your health care provider if you should: Be screened for bone loss. Take a calcium or vitamin D supplement to lower your risk of fractures. Be given hormone replacement therapy (HRT) to treat symptoms of menopause. Follow these instructions at home: Alcohol use Do not drink alcohol if: Your health care provider tells you not to drink. You are pregnant, may be pregnant, or are planning to become pregnant. If you drink alcohol: Limit how much you have to: 0-1 drink a day. Know how much alcohol is in your drink. In the [place] one drink equals one 12 oz bottle of beer (355 mL), one 5 oz glass of wine (148 mL), or one 1 oz glass of hard liquor (44 mL). Lifestyle Do not use any products that contain nicotine or tobacco. These products include cigarettes, chewing tobacco, and vaping devices, such as e-cigarettes. If you need help quitting, ask your health care provider. Do not use street drugs. Do not share needles. Ask your health care provider for help if you need support or information about quitting drugs. General instructions Schedule regular health, dental, and eye exams. Stay current with your vaccines. Tell your health care provider if: You often  feel depressed. You have ever been abused or do not feel safe at home. Summary Adopting a healthy lifestyle and getting preventive care are important in promoting health and wellness. Follow your health care provider's instructions about healthy diet, exercising, and getting tested or screened for diseases. Follow your health care provider's instructions on monitoring your cholesterol and blood pressure. This information is not intended to replace advice given to you by your health care provider. Make sure you discuss any questions you have with your health care provider. Document Revised: 02/19/2021 Document Reviewed: 02/19/2021 Elsevier Patient Education  2022 Elsevier Inc.  

## 2021-09-12 ENCOUNTER — Encounter: Payer: BC Managed Care – PPO | Admitting: Internal Medicine

## 2021-09-12 DIAGNOSIS — Z0000 Encounter for general adult medical examination without abnormal findings: Secondary | ICD-10-CM

## 2021-09-12 DIAGNOSIS — J4520 Mild intermittent asthma, uncomplicated: Secondary | ICD-10-CM

## 2021-09-12 DIAGNOSIS — J309 Allergic rhinitis, unspecified: Secondary | ICD-10-CM

## 2021-09-20 ENCOUNTER — Encounter: Payer: Self-pay | Admitting: Internal Medicine

## 2021-09-20 NOTE — Progress Notes (Addendum)
Subjective:    Patient ID: Gust Rung, female    DOB: 1990/12/01, 30 y.o.   MRN: [id number]   This visit occurred during the SARS-CoV-2 public health emergency.  Safety protocols were in place, including screening questions prior to the visit, additional usage of staff PPE, and extensive cleaning of exam room while observing appropriate contact time as indicated for disinfecting solutions.    HPI She is here for a physical exam.   She has no concerns.  Medications and allergies reviewed with patient and updated if appropriate.  Patient Active Problem List   Diagnosis Date Noted   Anemia 09/21/2021   Constipation 07/11/2020   Hemorrhoids 10/20/2019   Allergic rhinitis 08/28/2018   Ischial bursitis of left side 05/17/2016   Hamstring tendinitis at origin 05/17/2016   SVT (supraventricular tachycardia) (HCC) 05/07/2016   Eczema 05/07/2016   Left hip pain 05/07/2016   Mild intermittent asthma     Current Outpatient Medications on File Prior to Visit  Medication Sig Dispense Refill   albuterol (PROAIR HFA) 108 (90 Base) MCG/ACT inhaler 2 puffs every 4 hours if needed for wheezing or coughing spells.  May use 2 puffs 5-15 minutes prior to exercise. 18 g 1   cetirizine (ZYRTEC) 10 MG tablet Take 10 mg by mouth daily.     EPINEPHrine (AUVI-Q) 0.3 mg/0.3 mL IJ SOAJ injection Use as directed for severe allergic reaction. 2 each 3   fluticasone (FLONASE) 50 MCG/ACT nasal spray Two sprays each nostril once a day as needed for nasal congestion 16 g 5   hydrocortisone 2.5 % cream      No current facility-administered medications on file prior to visit.    Past Medical History:  Diagnosis Date   Anxiety    Asthma    mild intermittent   Eczema    History of echocardiogram    Echo 2/17: EF 55-60%, no RWMA, normal diastolic function    Past Surgical History:  Procedure Laterality Date   NONE      Social History   Socioeconomic History   Marital status: Single     Spouse name: Not on file   Number of children: Not on file   Years of education: Not on file   Highest education level: Not on file  Occupational History   Occupation: Runner, broadcasting/film/video    Comment: [employer] in [place], [place]  Tobacco Use   Smoking status: Never   Smokeless tobacco: Never  Vaping Use   Vaping Use: Never used  Substance and Sexual Activity   Alcohol use: Yes    Alcohol/week: 1.0 standard drink    Types: 1 Glasses of wine per week    Comment: occasional glass of wine   Drug use: No   Sexual activity: Not Currently    Birth control/protection: None  Other Topics Concern   Not on file  Social History Narrative   Graduate of [employer] in 2015.  Administrator, arts - HS.       Exercise: irregular, walking   Social Determinants of Health   Financial Resource Strain: Not on file  Food Insecurity: Not on file  Transportation Needs: Not on file  Physical Activity: Not on file  Stress: Not on file  Social Connections: Not on file    Family History  Problem Relation Age of Onset   Heart attack Father 71       deceased   Asthma Father    Allergic rhinitis Father  Eczema Mother    Asthma Sister    Allergic rhinitis Sister    Asthma Sister    Allergic rhinitis Sister    Asthma Sister    Allergic rhinitis Sister    Asthma Brother    Allergic rhinitis Brother    Diabetes Maternal Grandmother    Asthma Brother    Allergic rhinitis Brother    Asthma Brother    Allergic rhinitis Brother    Asthma Brother    Allergic rhinitis Brother    Asthma Brother    Allergic rhinitis Brother     Review of Systems  Constitutional:  Negative for chills and fever.  Eyes:  Negative for visual disturbance.  Respiratory:  Negative for cough, shortness of breath and wheezing.   Cardiovascular:  Negative for chest pain, palpitations and leg swelling.  Gastrointestinal:  Negative for abdominal pain, blood in stool, constipation, diarrhea and nausea.       No gerd   Genitourinary:  Negative for dysuria and hematuria.  Musculoskeletal:  Negative for arthralgias and back pain.  Skin:  Negative for color change and rash.  Neurological:  Positive for headaches (occ). Negative for dizziness and light-headedness.  Psychiatric/Behavioral:  Negative for dysphoric mood. The patient is not nervous/anxious.       Objective:   Vitals:   09/21/21 1427  BP: 100/62  Pulse: 77  Temp: 97.6 F (36.4 C)  SpO2: 98%   Filed Weights   09/21/21 1427  Weight: 123 lb (55.8 kg)   Body mass index is 23.09 kg/m.  BP Readings from Last 3 Encounters:  09/21/21 100/62  01/18/21 110/74  07/11/20 106/64    Wt Readings from Last 3 Encounters:  09/21/21 123 lb (55.8 kg)  01/18/21 114 lb (51.7 kg)  07/04/20 119 lb (54 kg)    Depression screen [facility] 2/9 01/18/2021 10/20/2019 08/28/2018  Decreased Interest 1 0 0  Down, Depressed, Hopeless 1 0 0  PHQ - 2 Score 2 0 0  Altered sleeping 2 - -  Tired, decreased energy 1 - -  Change in appetite 1 - -  Feeling bad or failure about yourself  1 - -  Trouble concentrating 1 - -  Moving slowly or fidgety/restless 0 - -  Suicidal thoughts 0 - -  PHQ-9 Score 8 - -  Difficult doing work/chores Somewhat difficult - -     GAD 7 : Generalized Anxiety Score 01/18/2021  Nervous, Anxious, on Edge 2  Control/stop worrying 2  Worry too much - different things 2  Trouble relaxing 2  Restless 1  Easily annoyed or irritable 2  Afraid - awful might happen 3  Total GAD 7 Score 14  Anxiety Difficulty Somewhat difficult       Physical Exam Constitutional: She appears well-developed and well-nourished. No distress.  HENT:  Head: Normocephalic and atraumatic.  Right Ear: External ear normal. Normal ear canal and TM Left Ear: External ear normal.  Normal ear canal and TM Mouth/Throat: Oropharynx is clear and moist.  Eyes: Conjunctivae and EOM are normal.  Neck: Neck supple. No tracheal deviation present. No thyromegaly present.   No carotid bruit  Cardiovascular: Normal rate, regular rhythm and normal heart sounds.   No murmur heard.  No edema. Pulmonary/Chest: Effort normal and breath sounds normal. No respiratory distress. She has no wheezes. She has no rales.  Breast: deferred   Abdominal: Soft. She exhibits no distension. There is no tenderness.  Lymphadenopathy: She has no cervical adenopathy.  Skin: Skin is  warm and dry. She is not diaphoretic.  Psychiatric: She has a normal mood and affect. Her behavior is normal.     Lab Results  Component Value Date   WBC 6.4 06/24/2016   HGB 10.3 (L) 06/24/2016   HCT 30.3 (L) 06/24/2016   PLT 326.0 06/24/2016   GLUCOSE 86 06/24/2016   CHOL 150 06/24/2016   TRIG 54.0 06/24/2016   HDL 55.60 06/24/2016   LDLCALC 83 06/24/2016   ALT 8 06/24/2016   AST 17 06/24/2016   NA 135 06/24/2016   K 3.7 06/24/2016   CL 104 06/24/2016   CREATININE 0.76 06/24/2016   BUN 9 06/24/2016   CO2 27 06/24/2016   TSH 0.95 06/24/2016         Assessment & Plan:   Physical exam: Screening blood work  ordered Exercise  running  Weight  normal Substance abuse  none   Reviewed recommended immunizations.   Health Maintenance  Topic Date Due   Hepatitis C Screening  Never done   PAP SMEAR-Modifier  Never done   COVID-19 Vaccine (1) 10/07/2021 (Originally 04[birth year]/1990)   INFLUENZA VACCINE  01/11/2022 (Originally 05/14/2021)   TETANUS/TDAP  06/24/2026   HIV Screening  Completed   HPV VACCINES  Aged Out   Pneumococcal Vaccine 72-79 Years old  Discontinued          See Problem List for Assessment and Plan of chronic medical problems.

## 2021-09-20 NOTE — Patient Instructions (Addendum)
° °Blood work was ordered.   ° ° °Medications changes include :  none  ° ° °Please followup in 1 year ° ° °Health Maintenance, Female °Adopting a healthy lifestyle and getting preventive care are important in promoting health and wellness. Ask your health care provider about: °The right schedule for you to have regular tests and exams. °Things you can do on your own to prevent diseases and keep yourself healthy. °What should I know about diet, weight, and exercise? °Eat a healthy diet ° °Eat a diet that includes plenty of vegetables, fruits, low-fat dairy products, and lean protein. °Do not eat a lot of foods that are high in solid fats, added sugars, or sodium. °Maintain a healthy weight °Body mass index (BMI) is used to identify weight problems. It estimates body fat based on height and weight. Your health care provider can help determine your BMI and help you achieve or maintain a healthy weight. °Get regular exercise °Get regular exercise. This is one of the most important things you can do for your health. Most adults should: °Exercise for at least 150 minutes each week. The exercise should increase your heart rate and make you sweat (moderate-intensity exercise). °Do strengthening exercises at least twice a week. This is in addition to the moderate-intensity exercise. °Spend less time sitting. Even light physical activity can be beneficial. °Watch cholesterol and blood lipids °Have your blood tested for lipids and cholesterol at 32 years of age, then have this test every 5 years. °Have your cholesterol levels checked more often if: °Your lipid or cholesterol levels are high. °You are older than 32 years of age. °You are at high risk for heart disease. °What should I know about cancer screening? °Depending on your health history and family history, you may need to have cancer screening at various ages. This may include screening for: °Breast cancer. °Cervical cancer. °Colorectal cancer. °Skin cancer. °Lung  cancer. °What should I know about heart disease, diabetes, and high blood pressure? °Blood pressure and heart disease °High blood pressure causes heart disease and increases the risk of stroke. This is more likely to develop in people who have high blood pressure readings or are overweight. °Have your blood pressure checked: °Every 3-5 years if you are 18-39 years of age. °Every year if you are 40 years old or older. °Diabetes °Have regular diabetes screenings. This checks your fasting blood sugar level. Have the screening done: °Once every three years after age 40 if you are at a normal weight and have a low risk for diabetes. °More often and at a younger age if you are overweight or have a high risk for diabetes. °What should I know about preventing infection? °Hepatitis B °If you have a higher risk for hepatitis B, you should be screened for this virus. Talk with your health care provider to find out if you are at risk for hepatitis B infection. °Hepatitis C °Testing is recommended for: °Everyone born from 1945 through 1965. °Anyone with known risk factors for hepatitis C. °Sexually transmitted infections (STIs) °Get screened for STIs, including gonorrhea and chlamydia, if: °You are sexually active and are younger than 32 years of age. °You are older than 32 years of age and your health care provider tells you that you are at risk for this type of infection. °Your sexual activity has changed since you were last screened, and you are at increased risk for chlamydia or gonorrhea. Ask your health care provider if you are at risk. °Ask your   health care provider about whether you are at high risk for HIV. Your health care provider may recommend a prescription medicine to help prevent HIV infection. If you choose to take medicine to prevent HIV, you should first get tested for HIV. You should then be tested every 3 months for as long as you are taking the medicine. °Pregnancy °If you are about to stop having your  period (premenopausal) and you may become pregnant, seek counseling before you get pregnant. °Take 400 to 800 micrograms (mcg) of folic acid every day if you become pregnant. °Ask for birth control (contraception) if you want to prevent pregnancy. °Osteoporosis and menopause °Osteoporosis is a disease in which the bones lose minerals and strength with aging. This can result in bone fractures. If you are 65 years old or older, or if you are at risk for osteoporosis and fractures, ask your health care provider if you should: °Be screened for bone loss. °Take a calcium or vitamin D supplement to lower your risk of fractures. °Be given hormone replacement therapy (HRT) to treat symptoms of menopause. °Follow these instructions at home: °Alcohol use °Do not drink alcohol if: °Your health care provider tells you not to drink. °You are pregnant, may be pregnant, or are planning to become pregnant. °If you drink alcohol: °Limit how much you have to: °0-1 drink a day. °Know how much alcohol is in your drink. In the [place] one drink equals one 12 oz bottle of beer (355 mL), one 5 oz glass of wine (148 mL), or one 1½ oz glass of hard liquor (44 mL). °Lifestyle °Do not use any products that contain nicotine or tobacco. These products include cigarettes, chewing tobacco, and vaping devices, such as e-cigarettes. If you need help quitting, ask your health care provider. °Do not use street drugs. °Do not share needles. °Ask your health care provider for help if you need support or information about quitting drugs. °General instructions °Schedule regular health, dental, and eye exams. °Stay current with your vaccines. °Tell your health care provider if: °You often feel depressed. °You have ever been abused or do not feel safe at home. °Summary °Adopting a healthy lifestyle and getting preventive care are important in promoting health and wellness. °Follow your health care provider's instructions about healthy diet, exercising, and  getting tested or screened for diseases. °Follow your health care provider's instructions on monitoring your cholesterol and blood pressure. °This information is not intended to replace advice given to you by your health care provider. Make sure you discuss any questions you have with your health care provider. °Document Revised: 02/19/2021 Document Reviewed: 02/19/2021 °Elsevier Patient Education © 2022 Elsevier Inc. ° °

## 2021-09-21 ENCOUNTER — Encounter: Payer: Self-pay | Admitting: Internal Medicine

## 2021-09-21 ENCOUNTER — Other Ambulatory Visit: Payer: Self-pay

## 2021-09-21 ENCOUNTER — Ambulatory Visit (INDEPENDENT_AMBULATORY_CARE_PROVIDER_SITE_OTHER): Payer: BC Managed Care – PPO | Admitting: Internal Medicine

## 2021-09-21 VITALS — BP 100/62 | HR 77 | Temp 97.6°F | Ht 61.2 in | Wt 123.0 lb

## 2021-09-21 DIAGNOSIS — Z114 Encounter for screening for human immunodeficiency virus [HIV]: Secondary | ICD-10-CM

## 2021-09-21 DIAGNOSIS — D649 Anemia, unspecified: Secondary | ICD-10-CM | POA: Diagnosis not present

## 2021-09-21 DIAGNOSIS — Z1159 Encounter for screening for other viral diseases: Secondary | ICD-10-CM | POA: Diagnosis not present

## 2021-09-21 DIAGNOSIS — J309 Allergic rhinitis, unspecified: Secondary | ICD-10-CM | POA: Diagnosis not present

## 2021-09-21 DIAGNOSIS — Z0000 Encounter for general adult medical examination without abnormal findings: Secondary | ICD-10-CM | POA: Diagnosis not present

## 2021-09-21 DIAGNOSIS — J4520 Mild intermittent asthma, uncomplicated: Secondary | ICD-10-CM | POA: Diagnosis not present

## 2021-09-21 LAB — LIPID PANEL
Cholesterol: 135 mg/dL (ref 0–200)
HDL: 64.4 mg/dL (ref >39.00)
LDL Cholesterol: 56 mg/dL (ref 0–99)
NonHDL: 70.88
Total CHOL/HDL Ratio: 2
Triglycerides: 75 mg/dL (ref 0.0–149.0)
VLDL: 15 mg/dL (ref 0.0–40.0)

## 2021-09-21 LAB — CBC WITH DIFFERENTIAL/PLATELET
Basophils Absolute: 0 10*3/uL (ref 0.0–0.1)
Basophils Relative: 0.9 % (ref 0.0–3.0)
Eosinophils Absolute: 0.2 10*3/uL (ref 0.0–0.7)
Eosinophils Relative: 3.7 % (ref 0.0–5.0)
HCT: 30.7 % — ABNORMAL LOW (ref 36.0–46.0)
Hemoglobin: 9.7 g/dL — ABNORMAL LOW (ref 12.0–15.0)
Lymphocytes Relative: 47.9 % — ABNORMAL HIGH (ref 12.0–46.0)
Lymphs Abs: 2.3 10*3/uL (ref 0.7–4.0)
MCHC: 31.7 g/dL (ref 30.0–36.0)
MCV: 81 fl (ref 78.0–100.0)
Monocytes Absolute: 0.4 10*3/uL (ref 0.1–1.0)
Monocytes Relative: 7.3 % (ref 3.0–12.0)
Neutro Abs: 2 10*3/uL (ref 1.4–7.7)
Neutrophils Relative %: 40.2 % — ABNORMAL LOW (ref 43.0–77.0)
Platelets: 379 10*3/uL (ref 150.0–400.0)
RBC: 3.8 Mil/uL — ABNORMAL LOW (ref 3.87–5.11)
RDW: 17 % — ABNORMAL HIGH (ref 11.5–15.5)
WBC: 4.9 10*3/uL (ref 4.0–10.5)

## 2021-09-21 LAB — COMPREHENSIVE METABOLIC PANEL
ALT: 13 U/L (ref 0–35)
AST: 26 U/L (ref 0–37)
Albumin: 4.4 g/dL (ref 3.5–5.2)
Alkaline Phosphatase: 73 U/L (ref 39–117)
BUN: 10 mg/dL (ref 6–23)
CO2: 29 mEq/L (ref 19–32)
Calcium: 9.7 mg/dL (ref 8.4–10.5)
Chloride: 103 mEq/L (ref 96–112)
Creatinine, Ser: 0.9 mg/dL (ref 0.40–1.20)
GFR: 84.92 mL/min (ref >60.00)
Glucose, Bld: 74 mg/dL (ref 70–99)
Potassium: 3.5 mEq/L (ref 3.5–5.1)
Sodium: 140 mEq/L (ref 135–145)
Total Bilirubin: 0.4 mg/dL (ref 0.2–1.2)
Total Protein: 7.8 g/dL (ref 6.0–8.3)

## 2021-09-21 LAB — TSH: TSH: 0.78 u[IU]/mL (ref 0.35–5.50)

## 2021-09-21 NOTE — Assessment & Plan Note (Signed)
Chronic Mild, intermittent Uses albuterol as needed - as not needed in a while

## 2021-09-21 NOTE — Assessment & Plan Note (Addendum)
chornic Her periods are shorter, but heavy the first 2 days Does not eat much meat Cbc, iron panel

## 2021-09-21 NOTE — Assessment & Plan Note (Signed)
Chronic Controlled, stable Continue zyrtec daily prn, flonase as needed

## 2021-09-21 NOTE — Addendum Note (Signed)
Addended by: Madelon Lips on: 09/21/2021 03:08 PM   Modules accepted: Orders

## 2021-09-24 LAB — IRON,TIBC AND FERRITIN PANEL
%SAT: 5 % (calc) — ABNORMAL LOW (ref 16–45)
Ferritin: 5 ng/mL — ABNORMAL LOW (ref 16–154)
Iron: 26 ug/dL — ABNORMAL LOW (ref 40–190)
TIBC: 486 mcg/dL (calc) — ABNORMAL HIGH (ref 250–450)

## 2021-09-24 LAB — HEPATITIS C ANTIBODY
Hepatitis C Ab: NONREACTIVE
SIGNAL TO CUT-OFF: 0.06 (ref <1.00)

## 2021-09-24 LAB — HIV ANTIBODY (ROUTINE TESTING W REFLEX): HIV 1&2 Ab, 4th Generation: NONREACTIVE

## 2021-11-15 ENCOUNTER — Ambulatory Visit (INDEPENDENT_AMBULATORY_CARE_PROVIDER_SITE_OTHER): Payer: BC Managed Care – PPO

## 2021-11-15 ENCOUNTER — Other Ambulatory Visit: Payer: Self-pay

## 2021-11-15 ENCOUNTER — Ambulatory Visit (INDEPENDENT_AMBULATORY_CARE_PROVIDER_SITE_OTHER): Payer: BC Managed Care – PPO | Admitting: Family Medicine

## 2021-11-15 VITALS — BP 102/70 | HR 60 | Ht 61.0 in | Wt 121.2 lb

## 2021-11-15 DIAGNOSIS — M79672 Pain in left foot: Secondary | ICD-10-CM | POA: Diagnosis not present

## 2021-11-15 DIAGNOSIS — M25572 Pain in left ankle and joints of left foot: Secondary | ICD-10-CM | POA: Diagnosis not present

## 2021-11-15 NOTE — Progress Notes (Signed)
I, Philbert Riser, LAT, ATC acting as a scribe for Clementeen Graham, MD.  Subjective:    CC: L ankle pain  HPI: Pt is a 31 y/o female c/o L ankle pain ongoing since Sunday, 1/29. Pt started having the L ankle pain while running a half-marathon, around mile 11. Pt locates pain to lateral midfoot mostly in the dorsal aspect.  She is able to finish the half marathon and noticed significant pain and swelling after she finished.  She is been treating with rest ice and elevation and has had significant improvement but still has pain with walking currently.  This is her first half marathon.  She has never run further than 11 miles.   L ankle swelling: yes- has improved Aggravates: walking, standing Treatments tried: compression sleeve, ice, Tylenol  Pertinent review of Systems: No fevers or chills  Relevant historical information: SVT.   Objective:    Vitals:   11/15/21 1438  BP: 102/70  Pulse: 60  SpO2: 96%   General: Well Developed, well nourished, and in no acute distress.   MSK: Left foot normal-appearing without significant swelling. Minimally tender left dorsal lateral midfoot. Normal foot and ankle motion. Minimal pain with resisted foot eversion otherwise normal strength testing. Stable ligamentous exam.   Lab and Radiology Results  X-ray images left foot and ankle obtained today personally and independently interpreted.  Left ankle: No acute fractures.  No significant degenerative changes.  Left foot: No acute fractures.  No significant degenerative changes.  Await for radiology review    Impression and Recommendations:    Assessment and Plan: 31 y.o. female with left dorsal lateral midfoot pain after running a half marathon.  Suspect either an overuse injury to the lateral foot tendons such as the peroneal tendons of the peroneal tertius tendon or potential stress reaction.  Plan to treat with relative rest and eccentric exercises focused on the peroneal  tendons.  If this is not sufficient to control her pain recommend using either a cam walker boot or postop shoe.  Recommend also calcium and vitamin D and recheck in 1 month.  We will start a gradual return to running program either at the 1 month mark or slightly before then.Marland Kitchen  PDMP not reviewed this encounter. Orders Placed This Encounter  Procedures   DG Ankle Complete Left    Standing Status:   Future    Number of Occurrences:   1    Standing Expiration Date:   11/15/2022    Order Specific Question:   Reason for Exam (SYMPTOM  OR DIAGNOSIS REQUIRED)    Answer:   left foot pain    Order Specific Question:   Preferred imaging location?    Answer:   [facility]    Order Specific Question:   Is patient pregnant?    Answer:   No   DG Foot Complete Left    Standing Status:   Future    Number of Occurrences:   1    Standing Expiration Date:   11/15/2022    Order Specific Question:   Reason for Exam (SYMPTOM  OR DIAGNOSIS REQUIRED)    Answer:   left foot pain    Order Specific Question:   Preferred imaging location?    Answer:   [facility]    Order Specific Question:   Is patient pregnant?    Answer:   No   No orders of the defined types were placed in this encounter.   Discussed warning  signs or symptoms. Please see discharge instructions. Patient expresses understanding.   The above documentation has been reviewed and is accurate and complete Clementeen Graham, M.D.

## 2021-11-15 NOTE — Patient Instructions (Addendum)
Thank you for coming in today.   Please complete the exercises that the athletic trainer went over with you:  View at [email or web address] using code: [id number]  1000-5000 units of Vitamin D daily and 1000mg  calcium daily  Relative rest  Recheck back in 1 month.

## 2021-11-19 NOTE — Progress Notes (Signed)
Left ankle x-ray looks normal to radiology.  No fractures or arthritis.

## 2021-11-19 NOTE — Progress Notes (Signed)
Left foot x-ray looks normal to radiology.  There is no visible fracture or arthritis present.

## 2021-12-13 ENCOUNTER — Ambulatory Visit: Payer: BC Managed Care – PPO | Admitting: Family Medicine

## 2021-12-13 NOTE — Progress Notes (Deleted)
? ?  I, Philbert Riser, LAT, ATC acting as a scribe for Clementeen Graham, MD. ? ?Brandy Pena is a 31 y.o. female who presents to [facility] today for f/u L foot pain ongoing since Sunday, 1/29. Pt started having the L ankle pain while running a half-marathon, around mile 11. Pt was last seen by Dr. Denyse Amass on 11/15/21 and was advised to rest and work on eccentric exercises focused on the peroneal tendons. Today, pt reports ? ?Dx imaging: 11/15/21 L foot & ankle XR ? ?Pertinent review of systems: *** ? ?Relevant historical information: *** ? ? ?Exam:  ?There were no vitals taken for this visit. ?General: Well Developed, well nourished, and in no acute distress.  ? ?MSK: *** ? ? ? ?Lab and Radiology Results ?No results found for this or any previous visit (from the past 72 hour(s)). ?No results found. ? ? ? ? ?Assessment and Plan: ?31 y.o. female with *** ? ? ?PDMP not reviewed this encounter. ?No orders of the defined types were placed in this encounter. ? ?No orders of the defined types were placed in this encounter. ? ? ? ?Discussed warning signs or symptoms. Please see discharge instructions. Patient expresses understanding. ? ? ?*** ? ?

## 2021-12-20 ENCOUNTER — Other Ambulatory Visit: Payer: Self-pay | Admitting: Internal Medicine

## 2021-12-24 MED ORDER — CETIRIZINE HCL 10 MG PO TABS: 10.0000 mg | ORAL_TABLET | Freq: Every day | ORAL | 2 refills | Status: AC

## 2022-02-28 DIAGNOSIS — Z1389 Encounter for screening for other disorder: Secondary | ICD-10-CM | POA: Diagnosis not present

## 2022-02-28 DIAGNOSIS — Z130 Encounter for screening for diseases of the blood and blood-forming organs and certain disorders involving the immune mechanism: Secondary | ICD-10-CM | POA: Diagnosis not present

## 2022-02-28 DIAGNOSIS — L293 Anogenital pruritus, unspecified: Secondary | ICD-10-CM | POA: Diagnosis not present

## 2022-02-28 DIAGNOSIS — N898 Other specified noninflammatory disorders of vagina: Secondary | ICD-10-CM | POA: Diagnosis not present

## 2022-02-28 DIAGNOSIS — Z01419 Encounter for gynecological examination (general) (routine) without abnormal findings: Secondary | ICD-10-CM | POA: Diagnosis not present

## 2022-04-04 ENCOUNTER — Encounter: Payer: Self-pay | Admitting: Internal Medicine

## 2022-04-04 ENCOUNTER — Telehealth (INDEPENDENT_AMBULATORY_CARE_PROVIDER_SITE_OTHER): Payer: BC Managed Care – PPO | Admitting: Internal Medicine

## 2022-04-04 DIAGNOSIS — J309 Allergic rhinitis, unspecified: Secondary | ICD-10-CM

## 2022-04-04 DIAGNOSIS — J4520 Mild intermittent asthma, uncomplicated: Secondary | ICD-10-CM | POA: Diagnosis not present

## 2022-04-04 MED ORDER — ALBUTEROL SULFATE HFA 108 (90 BASE) MCG/ACT IN AERS: INHALATION_SPRAY | RESPIRATORY_TRACT | 11 refills | Status: DC

## 2022-04-04 NOTE — Assessment & Plan Note (Signed)
Chronic Well-controlled Continue Zyrtec 10 mg daily, Flonase nasal spray daily as needed and albuterol inhaler as needed

## 2022-04-04 NOTE — Assessment & Plan Note (Signed)
Chronic Mild, intermittent asthma Typically symptomatic with allergies and colds Has had some wheezing intermittently with seasonal allergies this spring Needs refill of albuterol-sent to pharmacy Continue albuterol 2 puffs every 4 hours as needed for wheezing

## 2022-07-08 NOTE — Progress Notes (Unsigned)
Virtual Visit via Video Note  I connected with Brandy Pena on 07/08/22 at  3:00 PM EDT by a video enabled telemedicine application and verified that I am speaking with the correct person using two identifiers.   I discussed the limitations of evaluation and management by telemedicine and the availability of in person appointments. The patient expressed understanding and agreed to proceed.  Present for the visit:  Myself, Dr Billey Gosling, Carlean Jews.  The patient is currently at home and I am in the office.    No referring provider.    History of Present Illness: She is here for an acute visit for cold symptoms.   Her symptoms started 2 weeks ago  She is experiencing fever, severe headaches - teeth and eyes have been hurting and has not been able to eat anything for a couple of days.  The pain is on the right side of her face.  She has sinus pain, congestion, right ear pain, neck pain.   She has minimal cough at times and has had some wheezing.   She has tried taking sudafed  She denies prior sinus infections.   Review of Systems  Constitutional:  Positive for fever.  HENT:  Positive for congestion, ear pain (right ear), sinus pain (right side of face) and sore throat (resolved).   Respiratory:  Positive for cough (a little sporadically) and wheezing. Negative for shortness of breath.   Musculoskeletal:  Positive for neck pain.  Neurological:  Positive for headaches. Negative for dizziness.     Social History   Socioeconomic History   Marital status: Single    Spouse name: Not on file   Number of children: Not on file   Years of education: Not on file   Highest education level: Not on file  Occupational History   Occupation: Pharmacist, hospital    Comment: [employer] in [place], [place]  Tobacco Use   Smoking status: Never   Smokeless tobacco: Never  Vaping Use   Vaping Use: Never used  Substance and Sexual Activity   Alcohol use: Yes    Alcohol/week: 1.0 standard  drink of alcohol    Types: 1 Glasses of wine per week    Comment: occasional glass of wine   Drug use: No   Sexual activity: Not Currently    Birth control/protection: None  Other Topics Concern   Not on file  Social History Narrative   Graduate of [employer] in 2015.  Environmental consultant - HS.       Exercise: irregular, walking   Social Determinants of Health   Financial Resource Strain: Not on file  Food Insecurity: Not on file  Transportation Needs: Not on file  Physical Activity: Not on file  Stress: Not on file  Social Connections: Not on file     Observations/Objective: Appears well in NAD Congested Breathing normally, speaking in full sentences Skin appears warm and dry  Assessment and Plan:  See Problem List for Assessment and Plan of chronic medical problems.   Follow Up Instructions:    I discussed the assessment and treatment plan with the patient. The patient was provided an opportunity to ask questions and all were answered. The patient agreed with the plan and demonstrated an understanding of the instructions.   The patient was advised to call back or seek an in-person evaluation if the symptoms worsen or if the condition fails to improve as anticipated.    Binnie Rail, MD

## 2022-07-09 ENCOUNTER — Telehealth (INDEPENDENT_AMBULATORY_CARE_PROVIDER_SITE_OTHER): Payer: BC Managed Care – PPO | Admitting: Internal Medicine

## 2022-07-09 ENCOUNTER — Encounter: Payer: Self-pay | Admitting: Internal Medicine

## 2022-07-09 DIAGNOSIS — J0100 Acute maxillary sinusitis, unspecified: Secondary | ICD-10-CM | POA: Diagnosis not present

## 2022-07-09 DIAGNOSIS — J019 Acute sinusitis, unspecified: Secondary | ICD-10-CM | POA: Insufficient documentation

## 2022-07-09 MED ORDER — AMOXICILLIN-POT CLAVULANATE 875-125 MG PO TABS: 1.0000 | ORAL_TABLET | Freq: Two times a day (BID) | ORAL | 0 refills | Status: DC

## 2022-07-09 NOTE — Assessment & Plan Note (Signed)
Acute Likely bacterial  Start Augmentin 875-125 mg BID x 10 day otc cold medications Rest, fluid Call if no improvement  

## 2022-08-20 DIAGNOSIS — M545 Low back pain, unspecified: Secondary | ICD-10-CM | POA: Diagnosis not present

## 2022-10-04 DIAGNOSIS — M545 Low back pain, unspecified: Secondary | ICD-10-CM | POA: Diagnosis not present

## 2022-10-09 DIAGNOSIS — M25579 Pain in unspecified ankle and joints of unspecified foot: Secondary | ICD-10-CM | POA: Diagnosis not present

## 2022-10-09 DIAGNOSIS — M545 Low back pain, unspecified: Secondary | ICD-10-CM | POA: Diagnosis not present

## 2022-12-17 ENCOUNTER — Encounter: Payer: Self-pay | Admitting: Internal Medicine

## 2022-12-17 ENCOUNTER — Telehealth (INDEPENDENT_AMBULATORY_CARE_PROVIDER_SITE_OTHER): Payer: BC Managed Care – PPO | Admitting: Internal Medicine

## 2022-12-17 DIAGNOSIS — R439 Unspecified disturbances of smell and taste: Secondary | ICD-10-CM | POA: Diagnosis not present

## 2022-12-17 NOTE — Assessment & Plan Note (Signed)
Acute As a result of URI the end of January 2024 She did not check herself for COVID, but could have been COVID or some other viral illness Slight improvement recently but has not regained full taste and smell Discussed some things that may help, but there is nothing proven to work well Discussed smelling different this to see if that stimulates the olfactory nerve, can try using Flonase on a regular basis or anti-inflammatory She has started to see some improvement recently and that is a good sign that hopefully full taste and smell will return

## 2022-12-17 NOTE — Progress Notes (Signed)
Virtual Visit via telephone note  I connected with Brandy Pena on 12/17/22 at  3:00 PM EST by phone since video application was not working properly and verified that I am speaking with the correct person using two identifiers.   I discussed the limitations of evaluation and management by telemedicine and the availability of in person appointments. The patient expressed understanding and agreed to proceed.  Present for the visit:  Myself, Dr Billey Gosling, Carlean Jews.  The patient is currently at work and I am in the office.    No referring provider.    History of Present Illness: This is an acute visit for decreased sense of taste and smell.  She had an infection the end of January with fever, sinus pressure, congestion, headaches-she thinks is a sinus infection.  When she started to get better she realized that her taste and smell was not normal.  She has not regained her taste and smell which is concerning.  She did not test herself for COVID.  She did notice just recently that there has been a little bit of a change-possible improvement, but she wanted to know if there is anything she can do to help to get her taste and smell back.  Denies any cold symptoms at this time.   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  Environmental consultant - HS.       Exercise: irregular, walking   Social Determinants of Health   Financial Resource Strain: Not on file  Food Insecurity: Not on file  Transportation Needs: Not on file  Physical Activity: Not on file  Stress: Not on file  Social Connections: Not on file     Observations/Objective:    Assessment and Plan:  See Problem List for Assessment and Plan of chronic medical problems.   Follow Up Instructions:    I discussed the assessment and treatment plan with the patient. The patient was provided an opportunity to ask questions and all were answered. The patient agreed with the plan and demonstrated an understanding of the instructions.   The patient was advised to call back or seek an in-person evaluation if the symptoms worsen or if the condition fails to improve as anticipated.   Time spent on telephone call: 6 minutes  Binnie Rail, MD

## 2022-12-22 IMAGING — DX DG ANKLE COMPLETE 3+V*L*
3 series · 3 of 3 positions shown · non-contrast
Comparison: None.

CLINICAL DATA: Acute pain of left foot. Left foot and ankle pain
for 4 days. Pain onset while running a half marathon.

EXAM:
LEFT ANKLE COMPLETE - 3+ VIEW

[ankle ap]
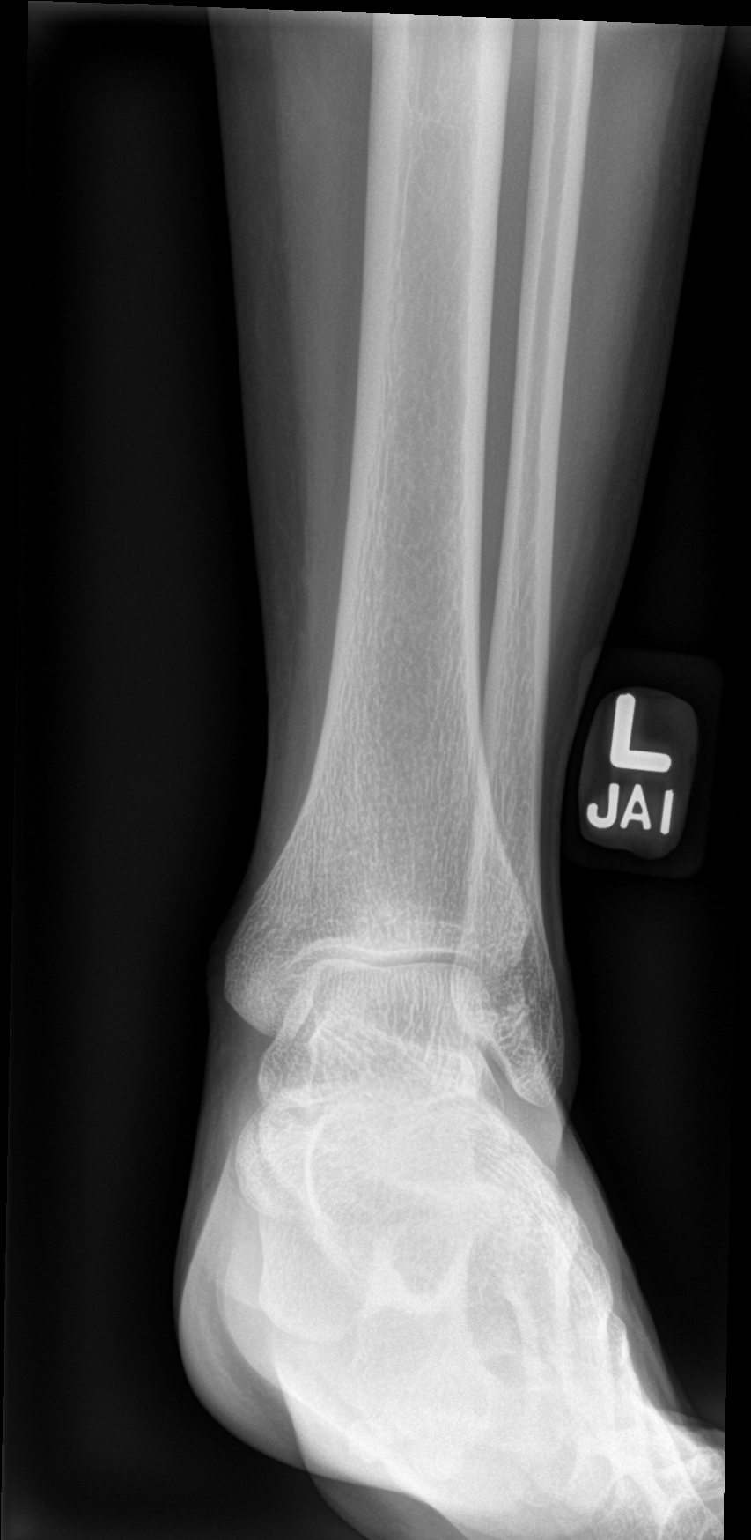

[ankle obl]
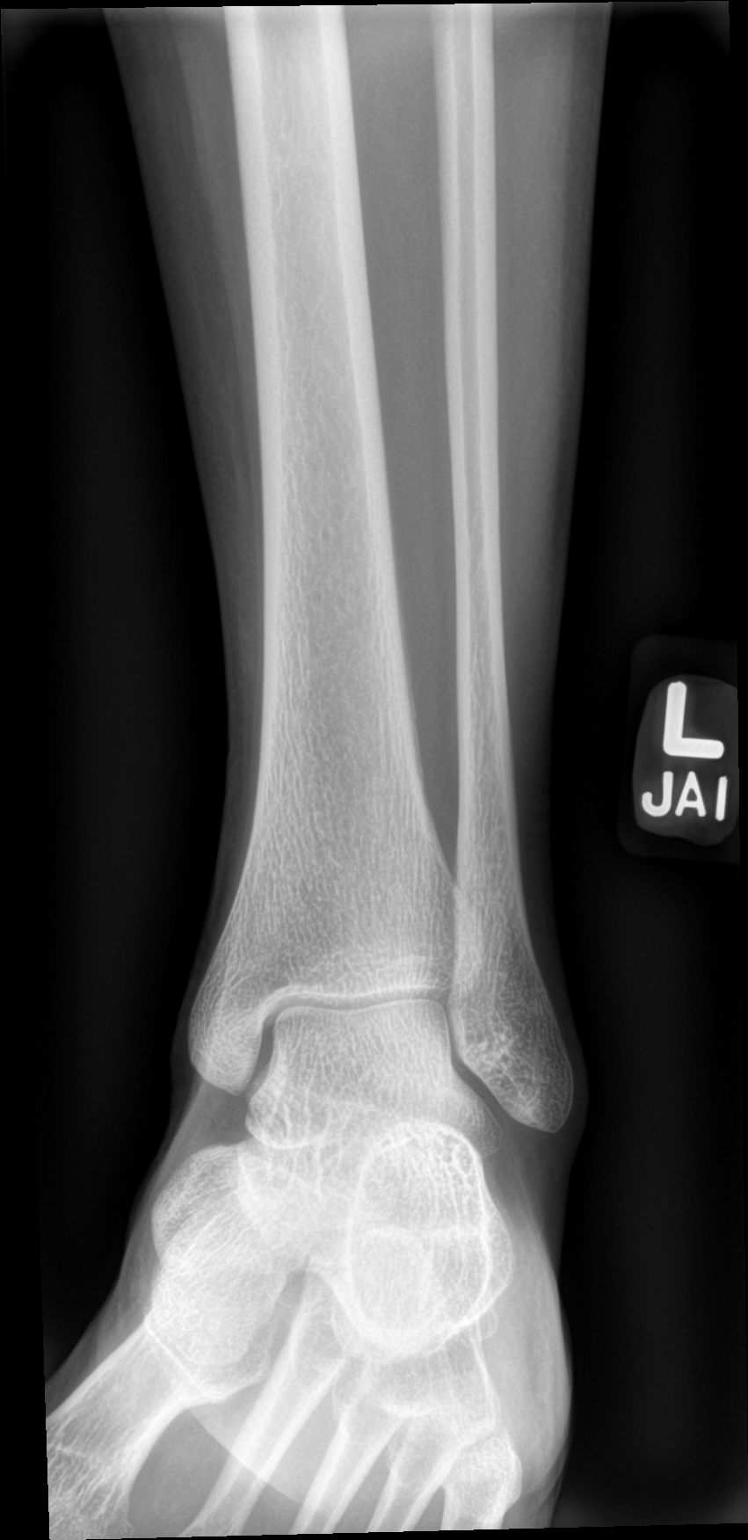

[ankle lat]
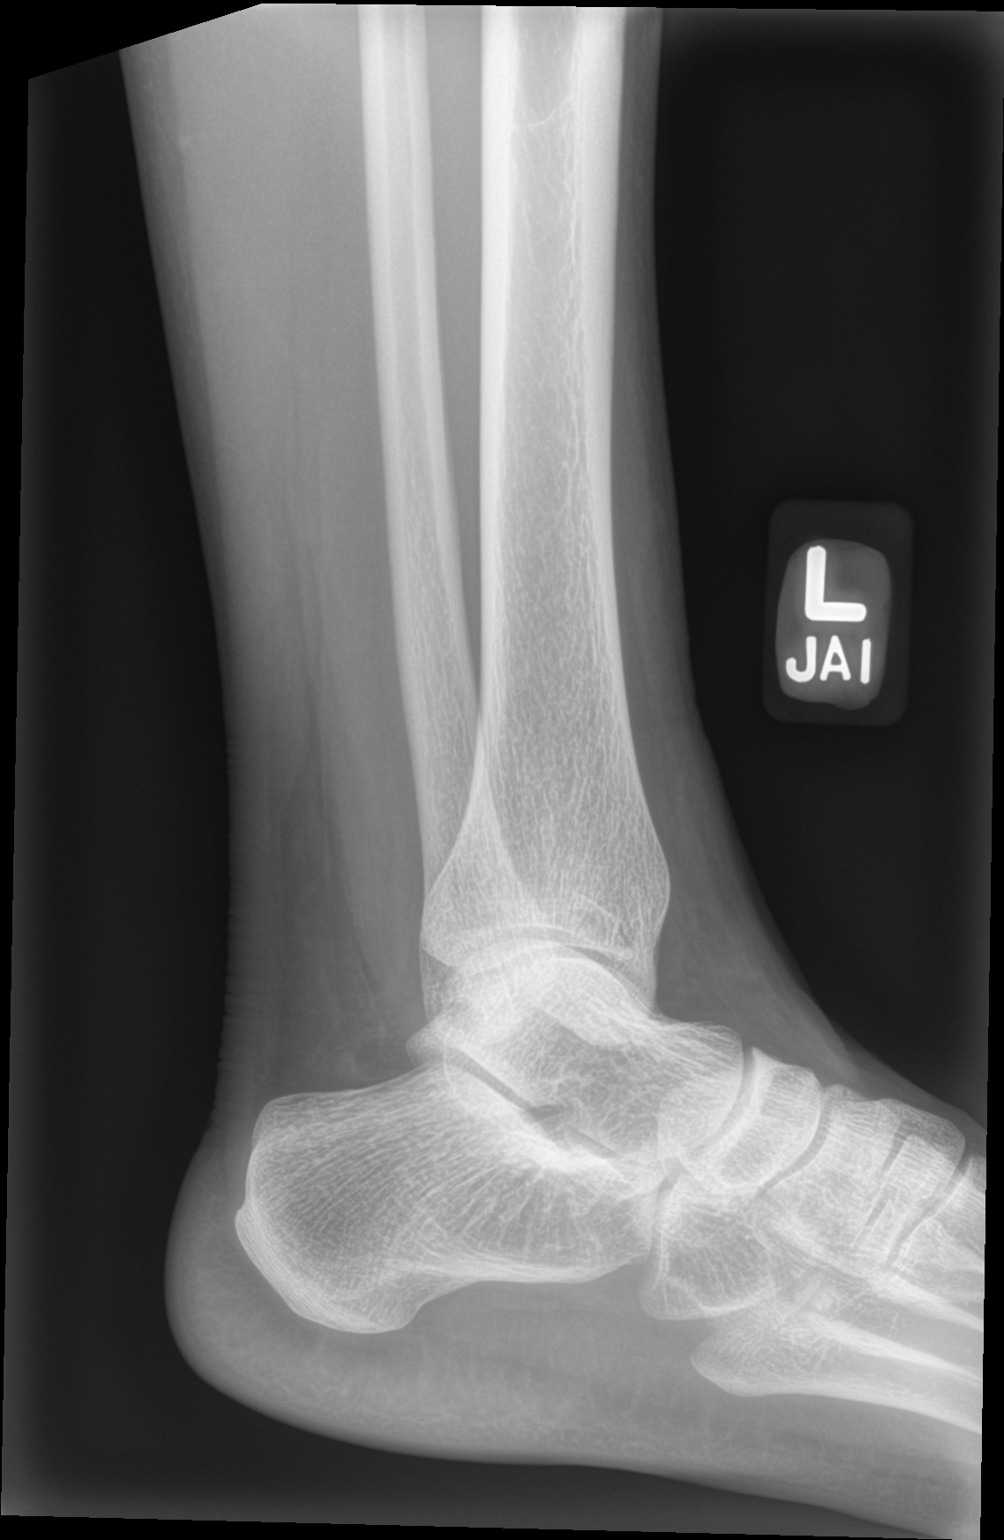

[3 of 3 positions shown; findings below may reference images not displayed]

FINDINGS: There is no evidence of fracture, dislocation, or joint effusion.
The ankle mortise is preserved. Intact talar dome. There is no
evidence of arthropathy or other focal bone abnormality. Soft
tissues are unremarkable.
IMPRESSION: Negative radiographs of the left ankle.

## 2022-12-22 IMAGING — DX DG FOOT COMPLETE 3+V*L*
3 series · 3 of 3 positions shown · non-contrast
Comparison: None.

CLINICAL DATA: Acute pain of left foot. Left foot and ankle pain
for 4 days. Pain onset while running a half marathon.

EXAM:
LEFT FOOT - COMPLETE 3+ VIEW

[foot ap]
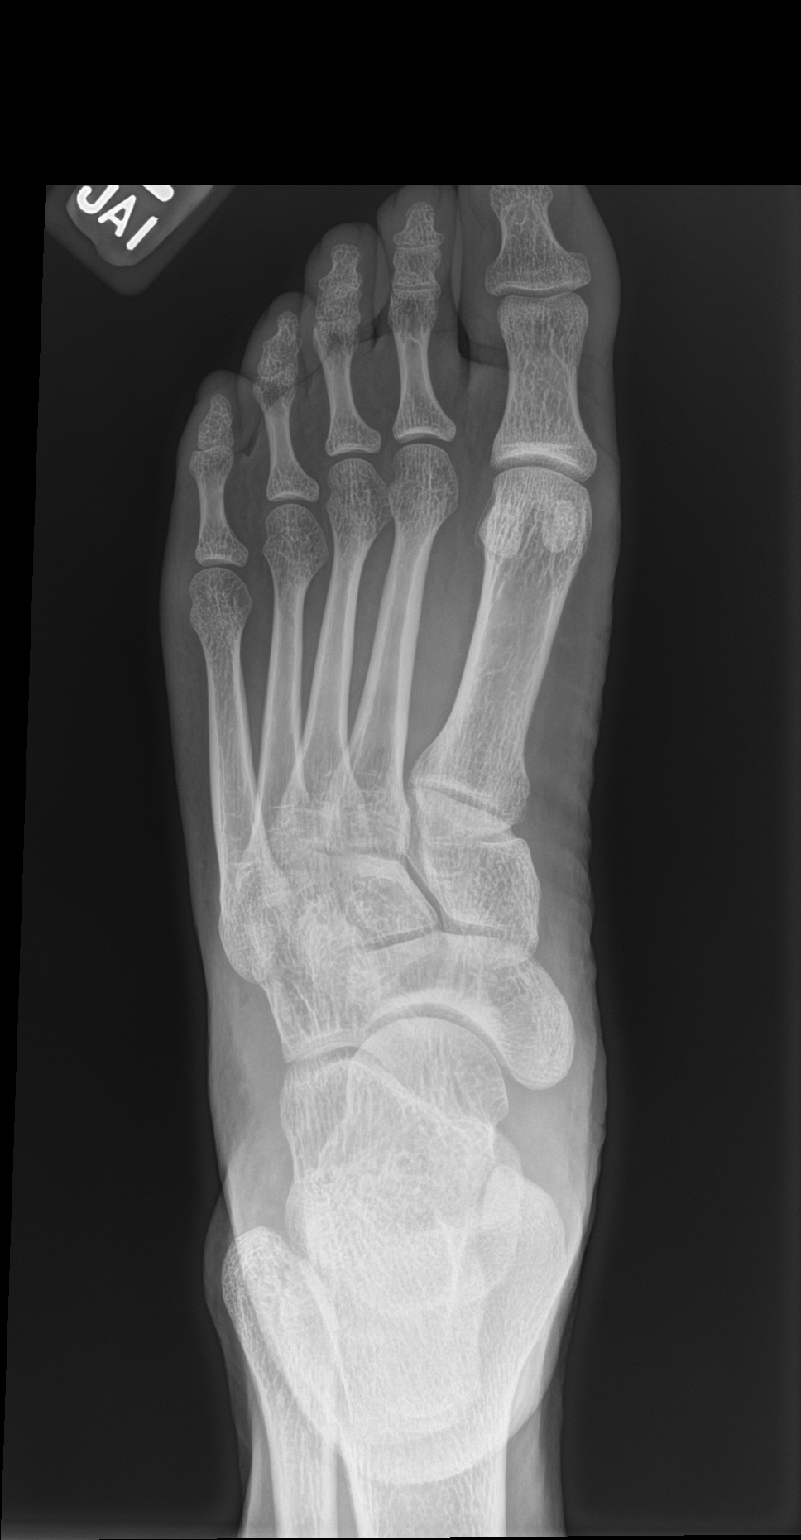

[foot obl]
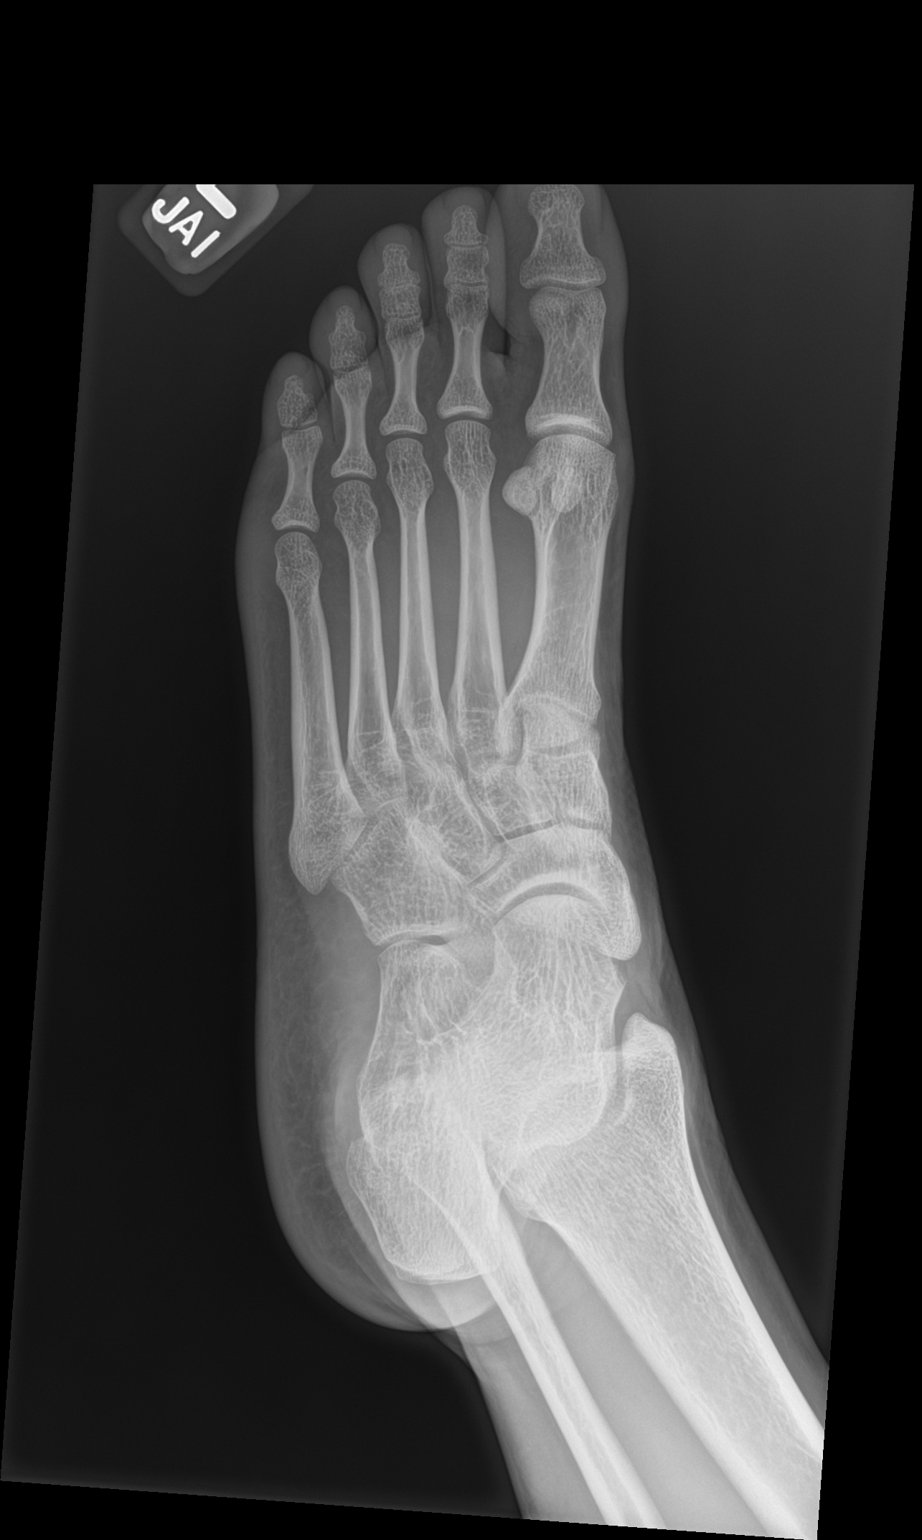

[foot lat]
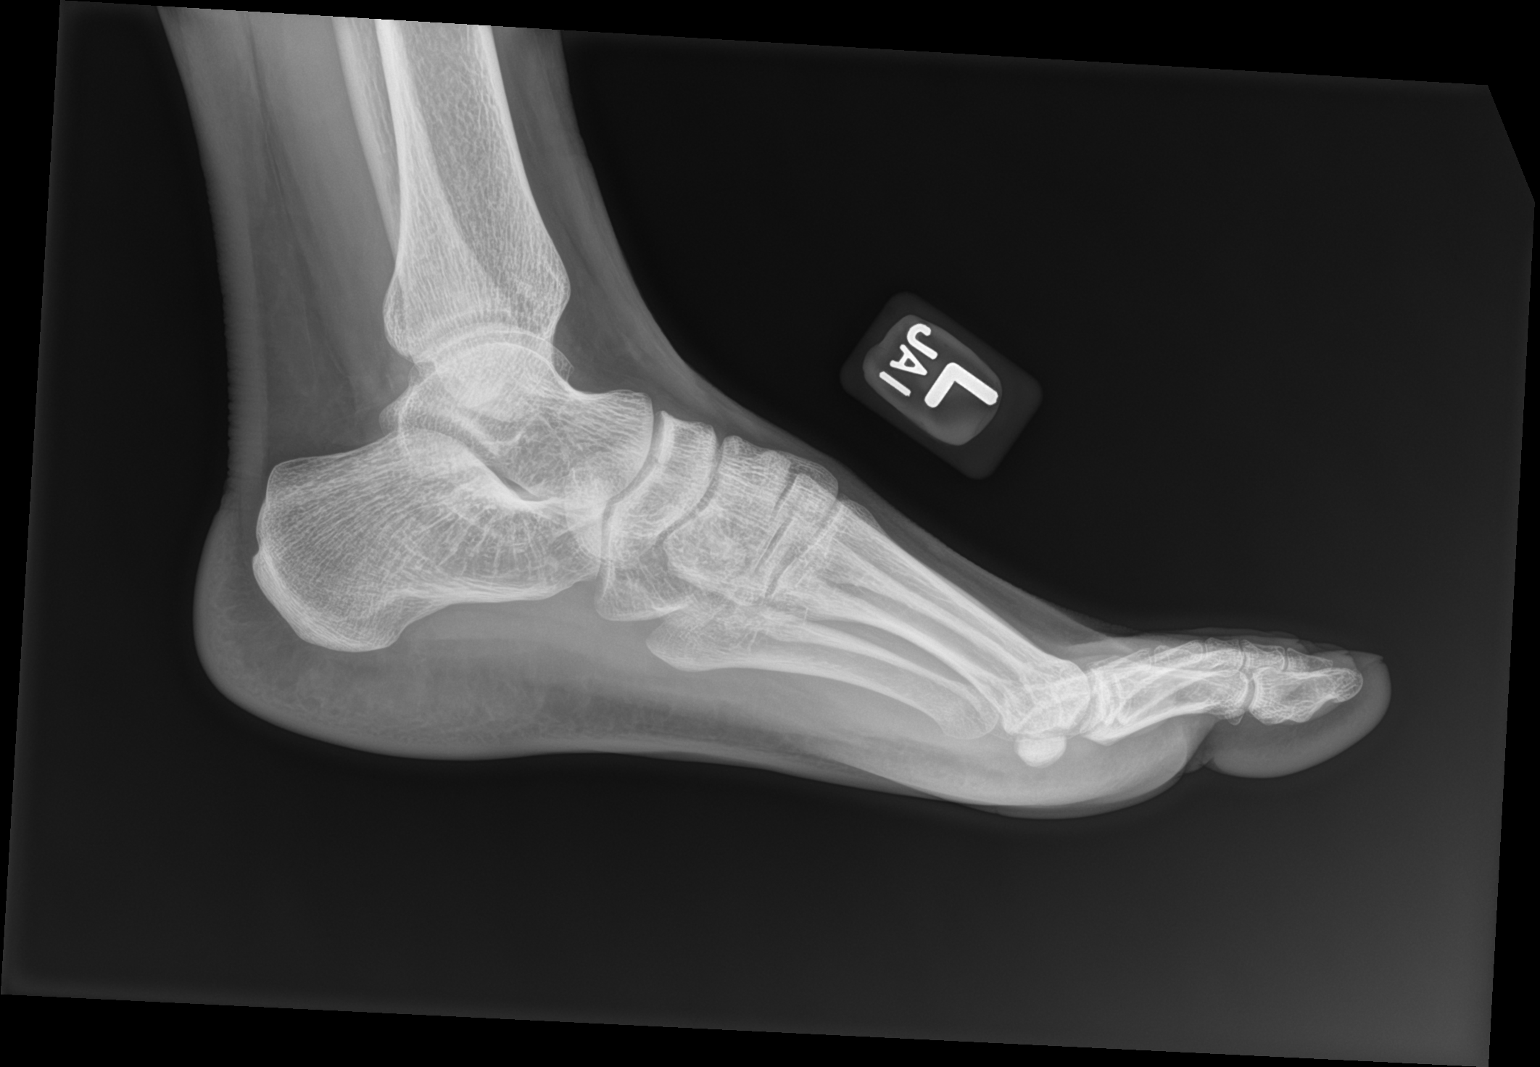

[3 of 3 positions shown; findings below may reference images not displayed]

FINDINGS: There is no evidence of fracture or dislocation. Normal alignment.
Normal joint spaces. There is no evidence of arthropathy or other
focal bone abnormality. Soft tissues are unremarkable.
IMPRESSION: Negative radiographs of the left foot.

## 2023-03-04 DIAGNOSIS — Z01419 Encounter for gynecological examination (general) (routine) without abnormal findings: Secondary | ICD-10-CM | POA: Diagnosis not present

## 2023-03-04 DIAGNOSIS — Z1389 Encounter for screening for other disorder: Secondary | ICD-10-CM | POA: Diagnosis not present

## 2023-03-04 DIAGNOSIS — Z30011 Encounter for initial prescription of contraceptive pills: Secondary | ICD-10-CM | POA: Diagnosis not present

## 2023-03-04 DIAGNOSIS — Z130 Encounter for screening for diseases of the blood and blood-forming organs and certain disorders involving the immune mechanism: Secondary | ICD-10-CM | POA: Diagnosis not present

## 2023-03-11 ENCOUNTER — Ambulatory Visit (INDEPENDENT_AMBULATORY_CARE_PROVIDER_SITE_OTHER): Payer: BC Managed Care – PPO | Admitting: Internal Medicine

## 2023-03-11 ENCOUNTER — Encounter: Payer: Self-pay | Admitting: Internal Medicine

## 2023-03-11 VITALS — BP 104/60 | HR 85 | Temp 98.5°F | Ht 61.0 in | Wt 128.0 lb

## 2023-03-11 DIAGNOSIS — J329 Chronic sinusitis, unspecified: Secondary | ICD-10-CM | POA: Insufficient documentation

## 2023-03-11 DIAGNOSIS — J45901 Unspecified asthma with (acute) exacerbation: Secondary | ICD-10-CM | POA: Diagnosis not present

## 2023-03-11 DIAGNOSIS — J320 Chronic maxillary sinusitis: Secondary | ICD-10-CM | POA: Diagnosis not present

## 2023-03-11 DIAGNOSIS — J309 Allergic rhinitis, unspecified: Secondary | ICD-10-CM

## 2023-03-11 MED ORDER — AZELASTINE-FLUTICASONE 137-50 MCG/ACT NA SUSP: 1.0000 | Freq: Two times a day (BID) | NASAL | 5 refills | Status: AC

## 2023-03-11 MED ORDER — PREDNISONE 20 MG PO TABS: 40.0000 mg | ORAL_TABLET | Freq: Every day | ORAL | 0 refills | Status: AC

## 2023-03-11 MED ORDER — AMOXICILLIN-POT CLAVULANATE 875-125 MG PO TABS: 1.0000 | ORAL_TABLET | Freq: Two times a day (BID) | ORAL | 0 refills | Status: DC

## 2023-03-11 MED ORDER — CEFDINIR 300 MG PO CAPS: 300.0000 mg | ORAL_CAPSULE | Freq: Two times a day (BID) | ORAL | 0 refills | Status: DC

## 2023-03-11 NOTE — Assessment & Plan Note (Signed)
Chronic Based on her symptoms and longevity of her symptoms she has a chronic sinus infection that never really improved for a few months ago Continue Zyrtec, saline rinses Will try Dymista if covered if not we will continue Flonase Continue albuterol as needed Start Omnicef 300 mg twice daily x 10 days Prednisone 40 mg daily x 5 days Call if symptoms do not improve/resolve

## 2023-03-11 NOTE — Assessment & Plan Note (Signed)
Acute Related to chronic sinus infection and allergy symptoms Continue albuterol every 6 hours as needed Given severe sinus congestion and pressure in addition to wheezing, mild shortness of breath will start prednisone 40 mg daily x 5 days Call if no improvement

## 2023-03-11 NOTE — Assessment & Plan Note (Signed)
Chronic Typically her allergies are well-controlled and her symptoms now are above and beyond typical allergies She will continue Zyrtec daily Will try Dymista-if not covered by her insurance she can continue Flonase Can consider adding in Singulair which she has taken in the past if symptoms do not improve

## 2023-03-11 NOTE — Progress Notes (Signed)
Subjective:    Patient ID: Brandy Pena, female    DOB: [birth year], 32 y.o.   MRN: [id number]      HPI Brandy Pena is here for  Chief Complaint  Patient presents with   Asthma    Sinus headache; Small bump inside of nose in right nostrils    Seasonal allergies, headaches, coughing - started in February.  Started after covid/flu, also had a sinus infection and that it just never really seem to get better.  She is felt like she is just having really severe allergies for few months..  Taking zyrtec,flonase, albteruol , sinus rinse-doing this consistently and nothing seems to be helping  She has nasal congestion, postnasal drip, sinus pressure, cough that is primarily dry, some shortness of breath and wheezing.  She is also having headaches.  This morning she noticed a bump in her nose     Medications and allergies reviewed with patient and updated if appropriate.  Current Outpatient Medications on File Prior to Visit  Medication Sig Dispense Refill   albuterol (PROAIR HFA) 108 (90 Base) MCG/ACT inhaler 2 puffs every 4 hours if needed for wheezing or coughing spells.  May use 2 puffs 5-15 minutes prior to exercise. 18 g 11   cetirizine (ZYRTEC) 10 MG tablet Take 1 tablet (10 mg total) by mouth daily. 90 tablet 2   EPINEPHrine (AUVI-Q) 0.3 mg/0.3 mL IJ SOAJ injection Use as directed for severe allergic reaction. 2 each 3   fluticasone (FLONASE) 50 MCG/ACT nasal spray Two sprays each nostril once a day as needed for nasal congestion 16 g 5   hydrocortisone 2.5 % cream      No current facility-administered medications on file prior to visit.    Review of Systems  Constitutional:  Negative for chills and fever.  HENT:  Positive for congestion, postnasal drip and sinus pressure. Negative for ear pain and sore throat.        Ears itch  Respiratory:  Positive for cough (dry), shortness of breath and wheezing.   Neurological:  Positive for headaches.       Objective:    Vitals:   03/11/23 1534  BP: 104/60  Pulse: 85  Temp: 98.5 F (36.9 C)  SpO2: 99%   BP Readings from Last 3 Encounters:  03/11/23 104/60  11/15/21 102/70  09/21/21 100/62   Wt Readings from Last 3 Encounters:  03/11/23 128 lb (58.1 kg)  11/15/21 121 lb 3.2 oz (55 kg)  09/21/21 123 lb (55.8 kg)   Body mass index is 24.19 kg/m.    Physical Exam Constitutional:      General: She is not in acute distress.    Appearance: Normal appearance. She is not ill-appearing.  HENT:     Head: Normocephalic and atraumatic.     Right Ear: Tympanic membrane, ear canal and external ear normal.     Left Ear: Tympanic membrane, ear canal and external ear normal.     Nose: Congestion present.     Mouth/Throat:     Mouth: Mucous membranes are moist.     Pharynx: No oropharyngeal exudate or posterior oropharyngeal erythema.  Eyes:     Conjunctiva/sclera: Conjunctivae normal.  Cardiovascular:     Rate and Rhythm: Normal rate and regular rhythm.  Pulmonary:     Effort: Pulmonary effort is normal. No respiratory distress.     Breath sounds: Normal breath sounds. No wheezing or rales.  Musculoskeletal:     Cervical back: Neck supple. No  tenderness.  Lymphadenopathy:     Cervical: No cervical adenopathy.  Skin:    General: Skin is warm and dry.  Neurological:     Mental Status: She is alert.            Assessment & Plan:    See Problem List for Assessment and Plan of chronic medical problems.

## 2023-03-11 NOTE — Patient Instructions (Addendum)
       Medications changes include :   Omnicef twice a day for 10 days, trial of dysmista nasal spray, prednisone 40 mg daily x 5 days     Return if symptoms worsen or fail to improve.

## 2023-03-21 ENCOUNTER — Telehealth: Payer: Self-pay

## 2023-03-21 NOTE — Telephone Encounter (Signed)
(  Key: [id number]) PA Case ID #: [id number] Rx #: [id number] Need Help? Call us at [phone] Outcome Approved today CaseId:[id number];Status:Approved;Review Type:Prior Auth;Coverage Start Date:02/19/2023;Coverage End Date:03/20/2024; Authorization Expiration Date: 03/19/2024

## 2023-03-23 ENCOUNTER — Encounter: Payer: Self-pay | Admitting: Internal Medicine

## 2023-03-23 NOTE — Progress Notes (Unsigned)
Virtual Visit via Video Note  I connected with Brandy Pena on 03/23/23 at  7:50 AM EDT by a video enabled telemedicine application and verified that I am speaking with the correct person using two identifiers.   I discussed the limitations of evaluation and management by telemedicine and the availability of in person appointments. The patient expressed understanding and agreed to proceed.  Present for the visit:  Myself, Dr Cheryll Cockayne, Lenard Galloway.  The patient is currently at home and I am in the office.    No referring provider.    History of Present Illness: This is an acute visit for persistent nasal congestion.  I saw her 5/28 and sinus infection - she completed omnicef x 10 days ,prednisone 40 mg x 5 days and has been taking an anti-histamine and using a nasal spray daily.   She still has congestion.     Social History   Socioeconomic History   Marital status: Single    Spouse name: Not on file   Number of children: Not on file   Years of education: Not on file   Highest education level: Not on file  Occupational History   Occupation: Runner, broadcasting/film/video    Comment: [employer] in [place], [place]  Tobacco Use   Smoking status: Never   Smokeless tobacco: Never  Vaping Use   Vaping Use: Never used  Substance and Sexual Activity   Alcohol use: Yes    Alcohol/week: 1.0 standard drink of alcohol    Types: 1 Glasses of wine per week    Comment: occasional glass of wine   Drug use: No   Sexual activity: Not Currently    Birth control/protection: None  Other Topics Concern   Not on file  Social History Narrative   Graduate of [employer] in 2015.  Administrator, arts - HS.       Exercise: irregular, walking   Social Determinants of Health   Financial Resource Strain: Not on file  Food Insecurity: Not on file  Transportation Needs: Not on file  Physical Activity: Not on file  Stress: Not on file  Social Connections: Not on file      Observations/Objective: Appears well in NAD   Assessment and Plan:  See Problem List for Assessment and Plan of chronic medical problems.   Follow Up Instructions:    I discussed the assessment and treatment plan with the patient. The patient was provided an opportunity to ask questions and all were answered. The patient agreed with the plan and demonstrated an understanding of the instructions.   The patient was advised to call back or seek an in-person evaluation if the symptoms worsen or if the condition fails to improve as anticipated.    Pincus Sanes, MD

## 2023-03-24 ENCOUNTER — Telehealth (INDEPENDENT_AMBULATORY_CARE_PROVIDER_SITE_OTHER): Payer: BC Managed Care – PPO | Admitting: Internal Medicine

## 2023-03-24 DIAGNOSIS — J320 Chronic maxillary sinusitis: Secondary | ICD-10-CM

## 2023-03-24 DIAGNOSIS — R0981 Nasal congestion: Secondary | ICD-10-CM | POA: Insufficient documentation

## 2023-03-24 MED ORDER — SULFAMETHOXAZOLE-TRIMETHOPRIM 800-160 MG PO TABS: 1.0000 | ORAL_TABLET | Freq: Two times a day (BID) | ORAL | 0 refills | Status: AC

## 2023-03-24 NOTE — Assessment & Plan Note (Signed)
Persistent nasal congestion for least 2 months Also having sinus pain and pressure Improved after course of antibiotics and prednisone She did have some discolored mucus and started to see improvement with the treatment confirming sinus infection Will treat again with Bactrim DS twice daily x 14 days Continue over-the-counter antihistamine-taking Zyrtec Continue Flonase and saline rinses

## 2023-03-24 NOTE — Assessment & Plan Note (Signed)
Chronic Symptoms ongoing despite 10-day antibiotic and 5 days of prednisone although this did help Symptoms still consistent with infection that I believe is chronic Continue Zyrtec, saline rinses, Flonase Continue albuterol as needed Start Bactrim DS mg twice daily x 14 days Referral to ENT just in case this does not improve, but I am hoping that she does not need that Call if symptoms do not improve/resolve

## 2023-03-27 NOTE — Telephone Encounter (Signed)
Pharmacy Patient Advocate Encounter  Prior Authorization for Azelastine-Fluticasone 137-50MCG/ACT suspension has been APPROVED by [facility] from 02/19/2023 to 03/20/2024.   PA # [id number]

## 2023-04-21 ENCOUNTER — Encounter: Payer: Self-pay | Admitting: Internal Medicine

## 2023-04-21 NOTE — Progress Notes (Unsigned)
Virtual Visit via Video Note  I connected with Brandy Pena on 04/21/23 at  8:10 AM EDT by a video enabled telemedicine application and verified that I am speaking with the correct person using two identifiers.   I discussed the limitations of evaluation and management by telemedicine and the availability of in person appointments. The patient expressed understanding and agreed to proceed.  Present for the visit:  Myself, Dr Cheryll Cockayne, Lenard Galloway.  The patient is currently at home and I am in the office.    No referring provider.    History of Present Illness: This is an acute visit for sinus infection  5/28 - omnicef x 10 day, prednisone 40 mg x 5 days 6/10 - bactrim ds x 7 days,  referred to ENT  After second abx her sense of taste and smell was better.  She had less congestion, sinus pressure / headaches.     Using flonase.  Feels like mucus in stuck in sinuses.    Has appt with ENT the end of August.    Review of Systems  Constitutional:  Negative for fever.  HENT:  Positive for congestion and sinus pain (light pressure). Negative for sore throat.   Respiratory:  Negative for cough, shortness of breath and wheezing.   Neurological:  Positive for headaches. Negative for dizziness.     Social History   Socioeconomic History   Marital status: Single    Spouse name: Not on file   Number of children: Not on file   Years of education: Not on file   Highest education level: Not on file  Occupational History   Occupation: Runner, broadcasting/film/video    Comment: [employer] in [place], [place]  Tobacco Use   Smoking status: Never   Smokeless tobacco: Never  Vaping Use   Vaping Use: Never used  Substance and Sexual Activity   Alcohol use: Yes    Alcohol/week: 1.0 standard drink of alcohol    Types: 1 Glasses of wine per week    Comment: occasional glass of wine   Drug use: No   Sexual activity: Not Currently    Birth control/protection: None  Other Topics Concern   Not  on file  Social History Narrative   Graduate of [employer] in 2015.  Administrator, arts - HS.       Exercise: irregular, walking   Social Determinants of Health   Financial Resource Strain: Not on file  Food Insecurity: Not on file  Transportation Needs: Not on file  Physical Activity: Not on file  Stress: Not on file  Social Connections: Not on file     Observations/Objective: Appears well in NAD Sounds congested  Assessment and Plan:  See Problem List for Assessment and Plan of chronic medical problems.   Follow Up Instructions:    I discussed the assessment and treatment plan with the patient. The patient was provided an opportunity to ask questions and all were answered. The patient agreed with the plan and demonstrated an understanding of the instructions.   The patient was advised to call back or seek an in-person evaluation if the symptoms worsen or if the condition fails to improve as anticipated.    Pincus Sanes, MD

## 2023-04-22 ENCOUNTER — Telehealth (INDEPENDENT_AMBULATORY_CARE_PROVIDER_SITE_OTHER): Payer: BC Managed Care – PPO | Admitting: Internal Medicine

## 2023-04-22 DIAGNOSIS — J320 Chronic maxillary sinusitis: Secondary | ICD-10-CM

## 2023-04-22 MED ORDER — SULFAMETHOXAZOLE-TRIMETHOPRIM 800-160 MG PO TABS: 1.0000 | ORAL_TABLET | Freq: Two times a day (BID) | ORAL | 0 refills | Status: AC

## 2023-04-22 NOTE — Assessment & Plan Note (Signed)
Chronic Ongoing for months S/p omnicef x 10 days, 5 days of prednisone and Bactrim DS x 7 days Still with symptoms - much better Continue Zyrtec, saline rinses, Flonase Continue albuterol as needed Start Bactrim DS mg twice daily x 14 days Has ENT appt the end of August Call if symptoms do not improve/resolve

## 2023-06-09 DIAGNOSIS — J329 Chronic sinusitis, unspecified: Secondary | ICD-10-CM | POA: Diagnosis not present

## 2023-06-09 DIAGNOSIS — J343 Hypertrophy of nasal turbinates: Secondary | ICD-10-CM | POA: Diagnosis not present

## 2023-06-09 DIAGNOSIS — J309 Allergic rhinitis, unspecified: Secondary | ICD-10-CM | POA: Diagnosis not present

## 2023-08-07 DIAGNOSIS — J329 Chronic sinusitis, unspecified: Secondary | ICD-10-CM | POA: Diagnosis not present

## 2023-08-07 DIAGNOSIS — J309 Allergic rhinitis, unspecified: Secondary | ICD-10-CM | POA: Diagnosis not present

## 2023-12-10 DIAGNOSIS — N939 Abnormal uterine and vaginal bleeding, unspecified: Secondary | ICD-10-CM | POA: Diagnosis not present

## 2023-12-18 DIAGNOSIS — D251 Intramural leiomyoma of uterus: Secondary | ICD-10-CM | POA: Diagnosis not present

## 2023-12-18 DIAGNOSIS — N840 Polyp of corpus uteri: Secondary | ICD-10-CM | POA: Diagnosis not present

## 2023-12-18 DIAGNOSIS — N939 Abnormal uterine and vaginal bleeding, unspecified: Secondary | ICD-10-CM | POA: Diagnosis not present

## 2024-01-26 NOTE — H&P (Signed)
 Brandy Pena is an 33 y.o. G0 female here for scheduled hysteroscopic myomectomy.  She has had irregular vaginal bleeding for several months and [place] noted a uterine polyp. OCP use did not help to regulate cycles thus decision to proceed with surgery to remove polyp  Pertinent Gynecological History: Menses:  irregular  Bleeding: dysfunctional uterine bleeding Contraception: OCP (estrogen/progesterone) DES exposure: denies Blood transfusions: none Sexually transmitted diseases: no past history Previous GYN Procedures:  none   Last mammogram:  m/a  Date:   Last pap: normal Date: 12/2020 OB History: G1, P0010  Menstrual History: Menarche age: 52 No LMP recorded.    Past Medical History:  Diagnosis Date   Anxiety    Asthma    mild intermittent   Eczema    History of echocardiogram    Echo 2/17: EF 55-60%, no RWMA, normal diastolic function    Past Surgical History:  Procedure Laterality Date   NONE      Family History  Problem Relation Age of Onset   Heart attack Father 62       deceased   Asthma Father    Allergic rhinitis Father    Eczema Mother    Asthma Sister    Allergic rhinitis Sister    Asthma Sister    Allergic rhinitis Sister    Asthma Sister    Allergic rhinitis Sister    Asthma Brother    Allergic rhinitis Brother    Diabetes Maternal Grandmother    Asthma Brother    Allergic rhinitis Brother    Asthma Brother    Allergic rhinitis Brother    Asthma Brother    Allergic rhinitis Brother    Asthma Brother    Allergic rhinitis Brother     Social History:  reports that she has never smoked. She has never used smokeless tobacco. She reports current alcohol use of about 1.0 standard drink of alcohol per week. She reports that she does not use drugs.  Allergies:  Allergies  Allergen Reactions   Food Other (See Comments)    TOFU-itching and rash    No medications prior to admission.    Review of Systems  Constitutional:  Positive for activity  change. Negative for fatigue and fever.  Eyes:  Negative for photophobia and visual disturbance.  Respiratory:  Negative for chest tightness and shortness of breath.   Cardiovascular:  Negative for chest pain, palpitations and leg swelling.  Gastrointestinal:  Negative for abdominal pain.  Genitourinary:  Positive for menstrual problem.  Musculoskeletal:  Negative for back pain.  Neurological:  Negative for light-headedness and headaches.  Psychiatric/Behavioral:  The patient is nervous/anxious.     There were no vitals taken for this visit. Physical Exam Constitutional:      Appearance: Normal appearance. She is normal weight.  Cardiovascular:     Rate and Rhythm: Normal rate.     Pulses: Normal pulses.  Pulmonary:     Effort: Pulmonary effort is normal.  Abdominal:     Palpations: Abdomen is soft.  Musculoskeletal:        General: Normal range of motion.     Cervical back: Normal range of motion.  Skin:    General: Skin is warm and dry.     Capillary Refill: Capillary refill takes 2 to 3 seconds.  Neurological:     General: No focal deficit present.     Mental Status: She is alert and oriented to person, place, and time. Mental status is at baseline.  Psychiatric:  Mood and Affect: Mood normal.        Behavior: Behavior normal.        Thought Content: Thought content normal.        Judgment: Judgment normal.     No results found for this or any previous visit (from the past 24 hours).  No results found.  Assessment/Plan: 33yo G1P0010 female with AUB-P here for hysteroscopic polypectomy - Admit - ERAS protocol  - Confirm consent  - To OR when ready   Lorrane Rosette Avari Nevares 01/26/2024, 12:42 AM

## 2024-01-27 ENCOUNTER — Encounter (HOSPITAL_COMMUNITY): Payer: Self-pay | Admitting: Obstetrics and Gynecology

## 2024-01-27 NOTE — Progress Notes (Signed)
 Spoke w/ via phone for pre-op interview--- Camilo Cella Lab needs dos---- CBC, T&S and UPT        Lab results------ COVID test -----patient states asymptomatic no test needed Arrive at -------0915 NPO after MN NO Solid Food.  Clear liquids from MN until---0815 Pre-Surgery Ensure or G2:  Med rec completed Medications to take morning of surgery ----- Albuterol PRN Diabetic medication -----  GLP1 agonist last dose: GLP1 instructions:  Patient instructed no nail polish to be worn day of surgery Patient instructed to bring photo id and insurance card day of surgery Patient aware to have Driver (ride ) / caregiver    for 24 hours after surgery - Derick Fleeting Patient Special Instructions ----- Shower with antibacterial soap. Pre-Op special Instructions -----  Patient verbalized understanding of instructions that were given at this phone interview. Patient denies chest pain, sob, fever, cough at the interview.

## 2024-02-05 ENCOUNTER — Other Ambulatory Visit: Payer: Self-pay

## 2024-02-05 ENCOUNTER — Encounter (HOSPITAL_COMMUNITY): Payer: Self-pay | Admitting: Obstetrics and Gynecology

## 2024-02-05 ENCOUNTER — Ambulatory Visit (HOSPITAL_COMMUNITY): Payer: Self-pay | Admitting: Anesthesiology

## 2024-02-05 ENCOUNTER — Ambulatory Visit (HOSPITAL_COMMUNITY)
Admission: RE | Admit: 2024-02-05 | Discharge: 2024-02-05 | Disposition: A | Discharge status: Home / Self Care | Attending: Obstetrics and Gynecology | Admitting: Obstetrics and Gynecology

## 2024-02-05 ENCOUNTER — Encounter (HOSPITAL_COMMUNITY)
Admission: RE | Disposition: A | Discharge status: Home / Self Care | Payer: Self-pay | Source: Home / Self Care | Attending: Obstetrics and Gynecology

## 2024-02-05 DIAGNOSIS — N840 Polyp of corpus uteri: Secondary | ICD-10-CM | POA: Diagnosis not present

## 2024-02-05 DIAGNOSIS — N938 Other specified abnormal uterine and vaginal bleeding: Secondary | ICD-10-CM | POA: Insufficient documentation

## 2024-02-05 DIAGNOSIS — I471 Supraventricular tachycardia, unspecified: Secondary | ICD-10-CM | POA: Insufficient documentation

## 2024-02-05 DIAGNOSIS — J45909 Unspecified asthma, uncomplicated: Secondary | ICD-10-CM | POA: Insufficient documentation

## 2024-02-05 HISTORY — PX: DILATATION & CURRETTAGE/HYSTEROSCOPY WITH RESECTOCOPE: SHX5572

## 2024-02-05 HISTORY — PX: MYOSURE RESECTION: SHX7611

## 2024-02-05 LAB — TYPE AND SCREEN
ABO/RH(D): O POS
Antibody Screen: NEGATIVE

## 2024-02-05 LAB — CBC
HCT: 43 % (ref 36.0–46.0)
Hemoglobin: 14.7 g/dL (ref 12.0–15.0)
MCH: 32.3 pg (ref 26.0–34.0)
MCHC: 34.2 g/dL (ref 30.0–36.0)
MCV: 94.5 fL (ref 80.0–100.0)
Platelets: 316 10*3/uL (ref 150–400)
RBC: 4.55 MIL/uL (ref 3.87–5.11)
RDW: 12.5 % (ref 11.5–15.5)
WBC: 6.8 10*3/uL (ref 4.0–10.5)
nRBC: 0 % (ref 0.0–0.2)

## 2024-02-05 LAB — POCT PREGNANCY, URINE: Preg Test, Ur: NEGATIVE

## 2024-02-05 LAB — ABO/RH: ABO/RH(D): O POS

## 2024-02-05 SURGERY — DILATATION & CURETTAGE/HYSTEROSCOPY WITH RESECTOCOPE
Anesthesia: General | Site: Vagina

## 2024-02-05 MED ORDER — OXYCODONE-ACETAMINOPHEN 5-325 MG PO TABS: 1.0000 | ORAL_TABLET | ORAL | 0 refills | Status: AC | PRN

## 2024-02-05 MED ORDER — CHLORHEXIDINE GLUCONATE 0.12 % MT SOLN
15.0000 mL | Freq: Once | OROMUCOSAL | Status: AC
  Administered 2024-02-05: 15 mL via OROMUCOSAL

## 2024-02-05 MED ORDER — DEXAMETHASONE SODIUM PHOSPHATE 10 MG/ML IJ SOLN
INTRAMUSCULAR | Status: DC | PRN
  Administered 2024-02-05: 10 mg via INTRAVENOUS

## 2024-02-05 MED ORDER — DEXAMETHASONE SODIUM PHOSPHATE 10 MG/ML IJ SOLN
INTRAMUSCULAR | Status: AC
  Filled 2024-02-05: qty 1

## 2024-02-05 MED ORDER — AMISULPRIDE (ANTIEMETIC) 5 MG/2ML IV SOLN: 10.0000 mg | Freq: Once | INTRAVENOUS | Status: DC | PRN

## 2024-02-05 MED ORDER — LIDOCAINE HCL 1 % IJ SOLN
INTRAMUSCULAR | Status: DC | PRN
  Administered 2024-02-05: 20 mL

## 2024-02-05 MED ORDER — ACETAMINOPHEN 500 MG PO TABS
1000.0000 mg | ORAL_TABLET | ORAL | Status: AC
  Administered 2024-02-05: 1000 mg via ORAL

## 2024-02-05 MED ORDER — ONDANSETRON HCL 4 MG/2ML IJ SOLN: 4.0000 mg | Freq: Once | INTRAMUSCULAR | Status: DC | PRN

## 2024-02-05 MED ORDER — KETOROLAC TROMETHAMINE 30 MG/ML IJ SOLN: 30.0000 mg | Freq: Once | INTRAMUSCULAR | Status: DC

## 2024-02-05 MED ORDER — PROPOFOL 10 MG/ML IV BOLUS
INTRAVENOUS | Status: DC | PRN
Start: 2024-02-05 — End: 2024-02-05
  Administered 2024-02-05: 150 mg via INTRAVENOUS

## 2024-02-05 MED ORDER — ONDANSETRON HCL 4 MG/2ML IJ SOLN
INTRAMUSCULAR | Status: AC
  Filled 2024-02-05: qty 2

## 2024-02-05 MED ORDER — ONDANSETRON HCL 4 MG/2ML IJ SOLN
INTRAMUSCULAR | Status: DC | PRN
  Administered 2024-02-05: 4 mg via INTRAVENOUS

## 2024-02-05 MED ORDER — LACTATED RINGERS IV SOLN: INTRAVENOUS | Status: DC

## 2024-02-05 MED ORDER — SODIUM CHLORIDE 0.9 % IR SOLN
Status: DC | PRN
  Administered 2024-02-05: 3000 mL

## 2024-02-05 MED ORDER — LIDOCAINE 2% (20 MG/ML) 5 ML SYRINGE
INTRAMUSCULAR | Status: AC
  Filled 2024-02-05: qty 5

## 2024-02-05 MED ORDER — FENTANYL CITRATE (PF) 250 MCG/5ML IJ SOLN
INTRAMUSCULAR | Status: AC
  Filled 2024-02-05: qty 5

## 2024-02-05 MED ORDER — ORAL CARE MOUTH RINSE: 15.0000 mL | Freq: Once | OROMUCOSAL | Status: AC

## 2024-02-05 MED ORDER — IBUPROFEN 600 MG PO TABS: 600.0000 mg | ORAL_TABLET | Freq: Four times a day (QID) | ORAL | 1 refills | Status: AC | PRN

## 2024-02-05 MED ORDER — PROPOFOL 500 MG/50ML IV EMUL
INTRAVENOUS | Status: DC | PRN
  Administered 2024-02-05: 130 ug/kg/min via INTRAVENOUS

## 2024-02-05 MED ORDER — POVIDONE-IODINE 10 % EX SWAB: 2.0000 | Freq: Once | CUTANEOUS | Status: DC

## 2024-02-05 MED ORDER — PROPOFOL 10 MG/ML IV BOLUS
INTRAVENOUS | Status: AC
  Filled 2024-02-05: qty 20

## 2024-02-05 MED ORDER — MIDAZOLAM HCL 2 MG/2ML IJ SOLN
INTRAMUSCULAR | Status: AC
  Filled 2024-02-05: qty 2

## 2024-02-05 MED ORDER — FENTANYL CITRATE (PF) 250 MCG/5ML IJ SOLN
INTRAMUSCULAR | Status: DC | PRN
  Administered 2024-02-05: 75 ug via INTRAVENOUS
  Administered 2024-02-05: 25 ug via INTRAVENOUS

## 2024-02-05 MED ORDER — FENTANYL CITRATE (PF) 100 MCG/2ML IJ SOLN: 25.0000 ug | INTRAMUSCULAR | Status: DC | PRN

## 2024-02-05 MED ORDER — LIDOCAINE 2% (20 MG/ML) 5 ML SYRINGE
INTRAMUSCULAR | Status: DC | PRN
Start: 2024-02-05 — End: 2024-02-05
  Administered 2024-02-05: 80 mg via INTRAVENOUS

## 2024-02-05 MED ORDER — CHLORHEXIDINE GLUCONATE 0.12 % MT SOLN
OROMUCOSAL | Status: DC
Start: 2024-02-05 — End: 2024-02-05
  Filled 2024-02-05: qty 15

## 2024-02-05 MED ORDER — ACETAMINOPHEN 500 MG PO TABS
ORAL_TABLET | ORAL | Status: DC
Start: 2024-02-05 — End: 2024-02-05
  Filled 2024-02-05: qty 2

## 2024-02-05 MED ORDER — LIDOCAINE HCL 1 % IJ SOLN
INTRAMUSCULAR | Status: AC
  Filled 2024-02-05: qty 20

## 2024-02-05 MED ORDER — MIDAZOLAM HCL 2 MG/2ML IJ SOLN
INTRAMUSCULAR | Status: DC | PRN
  Administered 2024-02-05: 2 mg via INTRAVENOUS

## 2024-02-05 SURGICAL SUPPLY — 14 items
CATH ROBINSON RED A/P 16FR (CATHETERS) ×1 IMPLANT
DEVICE MYOSURE LITE (MISCELLANEOUS) IMPLANT
DEVICE MYOSURE REACH (MISCELLANEOUS) IMPLANT
GAUZE 4X4 16PLY ~~LOC~~+RFID DBL (SPONGE) ×1 IMPLANT
GLOVE BIO SURGEON STRL SZ 6.5 (GLOVE) ×1 IMPLANT
GLOVE SURG UNDER POLY LF SZ7 (GLOVE) ×1 IMPLANT
GOWN STRL REUS W/ TWL LRG LVL3 (GOWN DISPOSABLE) ×2 IMPLANT
KIT PROCEDURE FLUENT (KITS) ×1 IMPLANT
KIT TURNOVER KIT B (KITS) ×1 IMPLANT
PACK VAGINAL MINOR WOMEN LF (CUSTOM PROCEDURE TRAY) ×1 IMPLANT
PAD OB MATERNITY 11 LF (PERSONAL CARE ITEMS) ×1 IMPLANT
SEAL ROD LENS SCOPE MYOSURE (ABLATOR) ×1 IMPLANT
TOWEL GREEN STERILE FF (TOWEL DISPOSABLE) ×2 IMPLANT
UNDERPAD 30X36 HEAVY ABSORB (UNDERPADS AND DIAPERS) ×1 IMPLANT

## 2024-02-05 NOTE — Anesthesia Preprocedure Evaluation (Addendum)
 Anesthesia Evaluation  Patient identified by MRN, date of birth, ID band Patient awake    Reviewed: Allergy  & Precautions, NPO status , Patient's Chart, lab work & pertinent test results  Airway Mallampati: II  TM Distance: >3 FB Neck ROM: Full    Dental  (+) Teeth Intact, Dental Advisory Given   Pulmonary asthma    Pulmonary exam normal breath sounds clear to auscultation       Cardiovascular + dysrhythmias Supra Ventricular Tachycardia  Rhythm:Regular Rate:Normal     Neuro/Psych  PSYCHIATRIC DISORDERS Anxiety     negative neurological ROS     GI/Hepatic negative GI ROS, Neg liver ROS,,,  Endo/Other  negative endocrine ROS    Renal/GU negative Renal ROS     Musculoskeletal negative musculoskeletal ROS (+)    Abdominal   Peds  Hematology negative hematology ROS (+)   Anesthesia Other Findings Day of surgery medications reviewed with the patient.  Reproductive/Obstetrics Polyp of corpus uteri                             Anesthesia Physical Anesthesia Plan  ASA: 2  Anesthesia Plan: General   Post-op Pain Management: Tylenol  PO (pre-op)* and Toradol  IV (intra-op)*   Induction: Intravenous  PONV Risk Score and Plan: 4 or greater and Midazolam , Dexamethasone  and Ondansetron   Airway Management Planned: LMA  Additional Equipment:   Intra-op Plan:   Post-operative Plan: Extubation in OR  Informed Consent: I have reviewed the patients History and Physical, chart, labs and discussed the procedure including the risks, benefits and alternatives for the proposed anesthesia with the patient or authorized representative who has indicated his/her understanding and acceptance.     Dental advisory given  Plan Discussed with: CRNA  Anesthesia Plan Comments:         Anesthesia Quick Evaluation

## 2024-02-05 NOTE — Interval H&P Note (Signed)
 History and Physical Interval Note: Pt seen today. Slight congestion but no fever or chills Reviewed expectations and verified consent  To OR when ready   02/05/2024 11:14 AM  Brandy Pena  has presented today for surgery, with the diagnosis of Polyp of corpus uteri N84.0.  The various methods of treatment have been discussed with the patient and family. After consideration of risks, benefits and other options for treatment, the patient has consented to  Procedure(s) with comments: DILATATION & CURETTAGE/HYSTEROSCOPY WITH RESECTOCOPE (N/A) - myosure MYOSURE RESECTION (N/A) as a surgical intervention.  The patient's history has been reviewed, patient examined, no change in status, stable for surgery.  I have reviewed the patient's chart and labs.  Questions were answered to the patient's satisfaction.     Levis Reams

## 2024-02-05 NOTE — Op Note (Signed)
 Operative Note    Preoperative Diagnosis Abnormal uterine bleeding Uterine polyp   Postoperative Diagnosis:  Abnormal uterine bleeding Uterine polyps   Procedure: Hysteroscopic resection of uterine polyps using myosure lite    Surgeon: Arlyne Lame, C DO   Anesthesia: General and 1% lidocaine    Fluids: LR EBL: 5ml UOP:   Findings: Two small 1cm posterior endometrial polyps and one left lateral 3cm endometrial polyp. Both ostia in normal anatomic position    Specimen: Endometrial polyps and curettings    Procedure Note Patient was taken to the operating room where general anesthesia was administered without difficulty. She was then prepped and draped in the normal sterile fashion while in the dorsal lithotomy position. An appropriate time out was performed.  A speculum was then placed in the vagina. The anterior lip of the cervix was grasped with a single toothed tenaculum and 1% lidocaine  plain injected at 3, 6, 9 and 12 o'clock for a paracervical block.  The uterus was then sounded to 7 cm and the [facility] dilators utilized to dilate the cervix up to size 19.  The Myosure operating scope was then introduced into the uterine cavity. The cavity was noted to have a secretory phase endometrium. There were two posterior wall polyps noted and a lateral left endometrial polyp. Both ostia were in normal anatomic position.  Next, using the myosure lite, the polyps were removed in their entirety. The endometrial cavity was also sampled with the [facility] lite. There was no active bleeding noted. The samples were handed off to be sent to pathology.  All instruments were then removed. The tenaculum site was noted to also be hemostatic.   Finally the speculum was removed from the vagina and the patient awakened and taken to the recovery room in stable condition.   All counts were noted to be correct x 2 per nursing staff

## 2024-02-05 NOTE — Anesthesia Postprocedure Evaluation (Signed)
 Anesthesia Post Note  Patient: Brandy Pena  Procedure(s) Performed: DILATATION & CURETTAGE/HYSTEROSCOPY WITH MYOSURE (Vagina ) MYOSURE RESECTION (Vagina )     Patient location during evaluation: PACU Anesthesia Type: General Level of consciousness: awake and alert Pain management: pain level controlled Vital Signs Assessment: post-procedure vital signs reviewed and stable Respiratory status: spontaneous breathing, nonlabored ventilation and respiratory function stable Cardiovascular status: blood pressure returned to baseline and stable Postop Assessment: no apparent nausea or vomiting Anesthetic complications: no   No notable events documented.  Last Vitals:  Vitals:   02/05/24 1330 02/05/24 1345  BP: 116/79 (!) 123/93  Pulse: 71 74  Resp: 18 14  Temp:  36.6 C  SpO2: 100% 100%    Last Pain:  Vitals:   02/05/24 1345  TempSrc:   PainSc: 1                  Erin Havers

## 2024-02-05 NOTE — Discharge Instructions (Addendum)
 Call office with any concerns [phone]      No acetaminophen /Tylenol  until after 3:45 pm today if needed.     Post Anesthesia Home Care Instructions  Activity: Get plenty of rest for the remainder of the day. A responsible individual must stay with you for 24 hours following the procedure.  For the next 24 hours, DO NOT: -Drive a car -Advertising copywriter -Drink alcoholic beverages -Take any medication unless instructed by your physician -Make any legal decisions or sign important papers.  Meals: Start with liquid foods such as gelatin or soup. Progress to regular foods as tolerated. Avoid greasy, spicy, heavy foods. If nausea and/or vomiting occur, drink only clear liquids until the nausea and/or vomiting subsides. Call your physician if vomiting continues.  Special Instructions/Symptoms: Your throat may feel dry or sore from the anesthesia or the breathing tube placed in your throat during surgery. If this causes discomfort, gargle with warm salt water. The discomfort should disappear within 24 hours.

## 2024-02-05 NOTE — Transfer of Care (Signed)
 Immediate Anesthesia Transfer of Care Note  Patient: Brandy Pena  Procedure(s) Performed: DILATATION & CURETTAGE/HYSTEROSCOPY WITH MYOSURE (Vagina ) MYOSURE RESECTION (Vagina )  Patient Location: PACU  Anesthesia Type:General  Level of Consciousness: awake, alert , oriented, and patient cooperative  Airway & Oxygen Therapy: Patient Spontanous Breathing and Patient connected to face mask oxygen  Post-op Assessment: Report given to RN and Post -op Vital signs reviewed and stable  Post vital signs: Reviewed and stable  Last Vitals:  Vitals Value Taken Time  BP 92/56 02/05/24 1236  Temp    Pulse 77 02/05/24 1237  Resp 16 02/05/24 1238  SpO2 100 % 02/05/24 1237  Vitals shown include unfiled device data.  Last Pain:  Vitals:   02/05/24 0933  TempSrc: Oral         Complications: No notable events documented.

## 2024-02-05 NOTE — Anesthesia Procedure Notes (Signed)
 Procedure Name: LMA Insertion Date/Time: 02/05/2024 12:05 PM  Performed by: Ballard Levels, CRNAPre-anesthesia Checklist: Patient identified, Emergency Drugs available, Suction available and Patient being monitored Patient Re-evaluated:Patient Re-evaluated prior to induction Oxygen Delivery Method: Circle System Utilized Preoxygenation: Pre-oxygenation with 100% oxygen Induction Type: IV induction Ventilation: Mask ventilation without difficulty LMA: LMA inserted LMA Size: 4.0 Number of attempts: 1 Airway Equipment and Method: Bite block Placement Confirmation: positive ETCO2 and breath sounds checked- equal and bilateral Tube secured with: Tape Dental Injury: Teeth and Oropharynx as per pre-operative assessment

## 2024-02-06 ENCOUNTER — Encounter (HOSPITAL_COMMUNITY): Payer: Self-pay | Admitting: Obstetrics and Gynecology

## 2024-02-06 LAB — SURGICAL PATHOLOGY

## 2024-02-09 NOTE — Progress Notes (Unsigned)
 Subjective:    Patient ID: Brandy Pena, female    DOB: [birth year], 33 y.o.   MRN: [id number]      HPI Brandy Pena is here for No chief complaint on file.   She is here for an acute visit for cold symptoms.   Her symptoms started   She is experiencing   She has tried taking       Medications and allergies reviewed with patient and updated if appropriate.  Current Outpatient Medications on File Prior to Visit  Medication Sig Dispense Refill   albuterol  (PROAIR  HFA) 108 (90 Base) MCG/ACT inhaler 2 puffs every 4 hours if needed for wheezing or coughing spells.  May use 2 puffs 5-15 minutes prior to exercise. 18 g 11   Azelastine -Fluticasone  137-50 MCG/ACT SUSP Place 1 spray into the nose every 12 (twelve) hours. 23 g 5   cetirizine  (ZYRTEC ) 10 MG tablet Take 1 tablet (10 mg total) by mouth daily. 90 tablet 2   EPINEPHrine  (AUVI-Q ) 0.3 mg/0.3 mL IJ SOAJ injection Use as directed for severe allergic reaction. 2 each 3   fluticasone  (FLONASE ) 50 MCG/ACT nasal spray Two sprays each nostril once a day as needed for nasal congestion 16 g 5   ibuprofen  (ADVIL ) 600 MG tablet Take 1 tablet (600 mg total) by mouth every 6 (six) hours as needed for moderate pain (pain score 4-6) or cramping. 20 tablet 1   norgestimate-ethinyl estradiol (ORTHO-CYCLEN) 0.25-35 MG-MCG tablet Take 1 tablet by mouth daily.     oxyCODONE -acetaminophen  (PERCOCET) 5-325 MG tablet Take 1 tablet by mouth every 4 (four) hours as needed for up to 12 days for severe pain (pain score 7-10). 28 tablet 0   No current facility-administered medications on file prior to visit.    Review of Systems     Objective:  There were no vitals filed for this visit. BP Readings from Last 3 Encounters:  02/05/24 (!) 123/93  03/11/23 104/60  11/15/21 102/70   Wt Readings from Last 3 Encounters:  02/05/24 129 lb (58.5 kg)  03/11/23 128 lb (58.1 kg)  11/15/21 121 lb 3.2 oz (55 kg)   There is no height or weight on file to  calculate BMI.    Physical Exam Constitutional:      General: She is not in acute distress.    Appearance: Normal appearance. She is not ill-appearing.  HENT:     Head: Normocephalic and atraumatic.     Right Ear: Tympanic membrane, ear canal and external ear normal.     Left Ear: Tympanic membrane, ear canal and external ear normal.     Mouth/Throat:     Mouth: Mucous membranes are moist.     Pharynx: No oropharyngeal exudate or posterior oropharyngeal erythema.  Eyes:     Conjunctiva/sclera: Conjunctivae normal.  Cardiovascular:     Rate and Rhythm: Normal rate and regular rhythm.  Pulmonary:     Effort: Pulmonary effort is normal. No respiratory distress.     Breath sounds: Normal breath sounds. No wheezing or rales.  Musculoskeletal:     Cervical back: Neck supple. No tenderness.  Lymphadenopathy:     Cervical: No cervical adenopathy.  Skin:    General: Skin is warm and dry.  Neurological:     Mental Status: She is alert.            Assessment & Plan:    See Problem List for Assessment and Plan of chronic medical problems.

## 2024-02-09 NOTE — Patient Instructions (Addendum)
       Medications changes include :   bactrim  twice daily x 1 week, albuterol  inhaler.      Return if symptoms worsen or fail to improve.

## 2024-02-10 ENCOUNTER — Encounter: Payer: Self-pay | Admitting: Internal Medicine

## 2024-02-10 ENCOUNTER — Ambulatory Visit (INDEPENDENT_AMBULATORY_CARE_PROVIDER_SITE_OTHER): Admitting: Internal Medicine

## 2024-02-10 VITALS — BP 110/78 | HR 76 | Temp 98.6°F | Ht 61.0 in | Wt 125.0 lb

## 2024-02-10 DIAGNOSIS — J019 Acute sinusitis, unspecified: Secondary | ICD-10-CM | POA: Diagnosis not present

## 2024-02-10 MED ORDER — ALBUTEROL SULFATE HFA 108 (90 BASE) MCG/ACT IN AERS: INHALATION_SPRAY | RESPIRATORY_TRACT | 11 refills | Status: AC

## 2024-02-10 MED ORDER — ALBUTEROL SULFATE HFA 108 (90 BASE) MCG/ACT IN AERS: INHALATION_SPRAY | RESPIRATORY_TRACT | 11 refills | Status: DC

## 2024-02-10 MED ORDER — SULFAMETHOXAZOLE-TRIMETHOPRIM 800-160 MG PO TABS: 1.0000 | ORAL_TABLET | Freq: Two times a day (BID) | ORAL | 0 refills | Status: AC

## 2024-02-10 NOTE — Assessment & Plan Note (Signed)
 Acute Likely bacterial  Start Bactrim  DS mg BID x 7 day otc cold medications, allergy  medications Rest, fluid Call if no improvement

## 2024-05-11 DIAGNOSIS — Z01419 Encounter for gynecological examination (general) (routine) without abnormal findings: Secondary | ICD-10-CM | POA: Diagnosis not present

## 2024-05-11 DIAGNOSIS — Z130 Encounter for screening for diseases of the blood and blood-forming organs and certain disorders involving the immune mechanism: Secondary | ICD-10-CM | POA: Diagnosis not present
